# Patient Record
Sex: Male | Born: 1935
Health system: Southern US, Community
[De-identification: ages and names within clinical notes are randomized; demographics above are authoritative.]

## PROBLEM LIST (undated history)

## (undated) DIAGNOSIS — I4891 Unspecified atrial fibrillation: Secondary | ICD-10-CM

## (undated) DIAGNOSIS — N400 Enlarged prostate without lower urinary tract symptoms: Secondary | ICD-10-CM

## (undated) DIAGNOSIS — E785 Hyperlipidemia, unspecified: Secondary | ICD-10-CM

## (undated) DIAGNOSIS — K5790 Diverticulosis of intestine, part unspecified, without perforation or abscess without bleeding: Secondary | ICD-10-CM

## (undated) DIAGNOSIS — S2239XA Fracture of one rib, unspecified side, initial encounter for closed fracture: Secondary | ICD-10-CM

## (undated) DIAGNOSIS — I10 Essential (primary) hypertension: Secondary | ICD-10-CM

## (undated) DIAGNOSIS — N529 Male erectile dysfunction, unspecified: Secondary | ICD-10-CM

## (undated) DIAGNOSIS — Z9289 Personal history of other medical treatment: Secondary | ICD-10-CM

## (undated) DIAGNOSIS — L409 Psoriasis, unspecified: Secondary | ICD-10-CM

## (undated) HISTORY — DX: Benign prostatic hyperplasia without lower urinary tract symptoms: N40.0

## (undated) HISTORY — PX: TRANSURETHRAL RESECTION OF PROSTATE: SHX73

## (undated) HISTORY — DX: Essential (primary) hypertension: I10

## (undated) HISTORY — PX: KIDNEY STONE SURGERY: SHX686

## (undated) HISTORY — PX: SHOULDER SURGERY: SHX246

## (undated) HISTORY — DX: Male erectile dysfunction, unspecified: N52.9

## (undated) HISTORY — PX: TONSILLECTOMY: SUR1361

## (undated) HISTORY — PX: OTHER SURGICAL HISTORY: SHX169

## (undated) HISTORY — DX: Psoriasis, unspecified: L40.9

## (undated) HISTORY — DX: Hyperlipidemia, unspecified: E78.5

## (undated) HISTORY — DX: Diverticulosis of intestine, part unspecified, without perforation or abscess without bleeding: K57.90

## (undated) HISTORY — DX: Personal history of other medical treatment: Z92.89

## (undated) HISTORY — PX: HEMORRHOID SURGERY: SHX153

---

## 1997-07-30 ENCOUNTER — Ambulatory Visit (HOSPITAL_COMMUNITY): Admission: RE | Admit: 1997-07-30 | Discharge: 1997-07-30 | Payer: Self-pay | Admitting: Pulmonary Disease

## 1998-12-24 ENCOUNTER — Encounter: Payer: Self-pay | Admitting: General Surgery

## 1998-12-24 ENCOUNTER — Encounter (INDEPENDENT_AMBULATORY_CARE_PROVIDER_SITE_OTHER): Payer: Self-pay | Admitting: Specialist

## 1998-12-24 ENCOUNTER — Ambulatory Visit (HOSPITAL_COMMUNITY): Admission: RE | Admit: 1998-12-24 | Discharge: 1998-12-24 | Payer: Self-pay | Admitting: General Surgery

## 2007-03-30 ENCOUNTER — Encounter: Admission: RE | Admit: 2007-03-30 | Discharge: 2007-03-30 | Payer: Self-pay | Admitting: Family Medicine

## 2007-12-22 LAB — HM COLONOSCOPY

## 2009-04-21 LAB — FECAL OCCULT BLOOD, GUAIAC: Fecal Occult Blood: NEGATIVE

## 2010-05-11 ENCOUNTER — Encounter: Payer: Self-pay | Admitting: Family Medicine

## 2010-05-11 DIAGNOSIS — E785 Hyperlipidemia, unspecified: Secondary | ICD-10-CM

## 2010-05-11 DIAGNOSIS — N400 Enlarged prostate without lower urinary tract symptoms: Secondary | ICD-10-CM

## 2010-05-11 DIAGNOSIS — L409 Psoriasis, unspecified: Secondary | ICD-10-CM

## 2010-05-11 DIAGNOSIS — K5790 Diverticulosis of intestine, part unspecified, without perforation or abscess without bleeding: Secondary | ICD-10-CM

## 2010-05-11 DIAGNOSIS — N529 Male erectile dysfunction, unspecified: Secondary | ICD-10-CM

## 2010-05-11 DIAGNOSIS — I129 Hypertensive chronic kidney disease with stage 1 through stage 4 chronic kidney disease, or unspecified chronic kidney disease: Secondary | ICD-10-CM | POA: Insufficient documentation

## 2010-05-11 DIAGNOSIS — I10 Essential (primary) hypertension: Secondary | ICD-10-CM

## 2011-11-27 ENCOUNTER — Encounter (HOSPITAL_COMMUNITY): Payer: Self-pay

## 2011-11-30 ENCOUNTER — Encounter (HOSPITAL_COMMUNITY): Payer: Self-pay

## 2011-11-30 ENCOUNTER — Encounter (HOSPITAL_COMMUNITY)
Admission: RE | Admit: 2011-11-30 | Discharge: 2011-11-30 | Disposition: A | Discharge status: Home / Self Care | Payer: Medicare Other | Source: Ambulatory Visit | Attending: Ophthalmology | Admitting: Ophthalmology

## 2011-11-30 LAB — BASIC METABOLIC PANEL
BUN: 9 mg/dL (ref 6–23)
Creatinine, Ser: 1.13 mg/dL (ref 0.50–1.35)
GFR calc Af Amer: 69 mL/min — ABNORMAL LOW (ref >90)
GFR calc non Af Amer: 60 mL/min — ABNORMAL LOW (ref >90)

## 2011-11-30 NOTE — Patient Instructions (Addendum)
Your procedure is scheduled on: 12/04/2011  Report to [facility] at   900      AM.  Call this number if you have problems the morning of surgery: [phone]   Do not eat food or drink liquids :After Midnight.      Take these medicines the morning of surgery with A SIP OF WATER: diovan   Do not wear jewelry, make-up or nail polish.  Do not wear lotions, powders, or perfumes. You may wear deodorant.  Do not shave 48 hours prior to surgery.  Do not bring valuables to the hospital.  Contacts, dentures or bridgework may not be worn into surgery.  Leave suitcase in the car. After surgery it may be brought to your room.  For patients admitted to the hospital, checkout time is 11:00 AM the day of discharge.   Patients discharged the day of surgery will not be allowed to drive home.  :     Please read over the following fact sheets that you were given: Coughing and Deep Breathing, Surgical Site Infection Prevention, Anesthesia Post-op Instructions and Care and Recovery After Surgery    Cataract A cataract is a clouding of the lens of the eye. When a lens becomes cloudy, vision is reduced based on the degree and nature of the clouding. Many cataracts reduce vision to some degree. Some cataracts make people more near-sighted as they develop. Other cataracts increase glare. Cataracts that are ignored and become worse can sometimes look white. The white color can be seen through the pupil. CAUSES   Aging. However, cataracts may occur at any age, even in newborns.   Certain drugs.   Trauma to the eye.   Certain diseases such as diabetes.   Specific eye diseases such as chronic inflammation inside the eye or a sudden attack of a rare form of glaucoma.   Inherited or acquired medical problems.  SYMPTOMS   Gradual, progressive drop in vision in the affected eye.   Severe, rapid visual loss. This most often happens when trauma is the cause.  DIAGNOSIS  To detect a cataract, an eye doctor  examines the lens. Cataracts are best diagnosed with an exam of the eyes with the pupils enlarged (dilated) by drops.  TREATMENT  For an early cataract, vision may improve by using different eyeglasses or stronger lighting. If that does not help your vision, surgery is the only effective treatment. A cataract needs to be surgically removed when vision loss interferes with your everyday activities, such as driving, reading, or watching TV. A cataract may also have to be removed if it prevents examination or treatment of another eye problem. Surgery removes the cloudy lens and usually replaces it with a substitute lens (intraocular lens, IOL).  At a time when both you and your doctor agree, the cataract will be surgically removed. If you have cataracts in both eyes, only one is usually removed at a time. This allows the operated eye to heal and be out of danger from any possible problems after surgery (such as infection or poor wound healing). In rare cases, a cataract may be doing damage to your eye. In these cases, your caregiver may advise surgical removal right away. The vast majority of people who have cataract surgery have better vision afterward. HOME CARE INSTRUCTIONS  If you are not planning surgery, you may be asked to do the following:  Use different eyeglasses.   Use stronger or brighter lighting.   Ask your eye doctor about  reducing your medicine dose or changing medicines if it is thought that a medicine caused your cataract. Changing medicines does not make the cataract go away on its own.   Become familiar with your surroundings. Poor vision can lead to injury. Avoid bumping into things on the affected side. You are at a higher risk for tripping or falling.   Exercise extreme care when driving or operating machinery.   Wear sunglasses if you are sensitive to bright light or experiencing problems with glare.  SEEK IMMEDIATE MEDICAL CARE IF:   You have a worsening or sudden vision  loss.   You notice redness, swelling, or increasing pain in the eye.   You have a fever.  Document Released: 02/06/2005 Document Revised: 01/26/2011 Document Reviewed: 09/30/2010 [facility] Patient Information 2012 [place], [place].PATIENT INSTRUCTIONS POST-ANESTHESIA  IMMEDIATELY FOLLOWING SURGERY:  Do not drive or operate machinery for the first twenty four hours after surgery.  Do not make any important decisions for twenty four hours after surgery or while taking narcotic pain medications or sedatives.  If you develop intractable nausea and vomiting or a severe headache please notify your doctor immediately.  FOLLOW-UP:  Please make an appointment with your surgeon as instructed. You do not need to follow up with anesthesia unless specifically instructed to do so.  WOUND CARE INSTRUCTIONS (if applicable):  Keep a dry clean dressing on the anesthesia/puncture wound site if there is drainage.  Once the wound has quit draining you may leave it open to air.  Generally you should leave the bandage intact for twenty four hours unless there is drainage.  If the epidural site drains for more than 36-48 hours please call the anesthesia department.  QUESTIONS?:  Please feel free to call your physician or the hospital operator if you have any questions, and they will be happy to assist you.

## 2011-12-01 MED ORDER — NEOMYCIN-POLYMYXIN-DEXAMETH 3.5-10000-0.1 OP OINT
TOPICAL_OINTMENT | OPHTHALMIC | Status: AC
  Filled 2011-12-01: qty 3.5

## 2011-12-01 MED ORDER — CYCLOPENTOLATE HCL 1 % OP SOLN
OPHTHALMIC | Status: AC
  Filled 2011-12-01: qty 2

## 2011-12-01 MED ORDER — LIDOCAINE HCL 3.5 % OP GEL
OPHTHALMIC | Status: AC
  Filled 2011-12-01: qty 5

## 2011-12-01 MED ORDER — LIDOCAINE HCL (PF) 1 % IJ SOLN
INTRAMUSCULAR | Status: AC
  Filled 2011-12-01: qty 2

## 2011-12-01 MED ORDER — TETRACAINE HCL 0.5 % OP SOLN
OPHTHALMIC | Status: AC
  Filled 2011-12-01: qty 2

## 2011-12-01 MED ORDER — PHENYLEPHRINE HCL 2.5 % OP SOLN
OPHTHALMIC | Status: AC
  Filled 2011-12-01: qty 2

## 2011-12-04 ENCOUNTER — Encounter (HOSPITAL_COMMUNITY): Payer: Self-pay | Admitting: *Deleted

## 2011-12-04 ENCOUNTER — Encounter (HOSPITAL_COMMUNITY): Payer: Self-pay | Admitting: Anesthesiology

## 2011-12-04 ENCOUNTER — Ambulatory Visit (HOSPITAL_COMMUNITY)
Admission: RE | Admit: 2011-12-04 | Discharge: 2011-12-04 | Disposition: A | Discharge status: Home / Self Care | Payer: Medicare Other | Source: Ambulatory Visit | Attending: Ophthalmology | Admitting: Ophthalmology

## 2011-12-04 ENCOUNTER — Encounter (HOSPITAL_COMMUNITY)
Admission: RE | Disposition: A | Discharge status: Home / Self Care | Payer: Self-pay | Source: Ambulatory Visit | Attending: Ophthalmology

## 2011-12-04 ENCOUNTER — Ambulatory Visit (HOSPITAL_COMMUNITY): Payer: Medicare Other | Admitting: Anesthesiology

## 2011-12-04 DIAGNOSIS — Z01810 Encounter for preprocedural cardiovascular examination: Secondary | ICD-10-CM | POA: Insufficient documentation

## 2011-12-04 DIAGNOSIS — I10 Essential (primary) hypertension: Secondary | ICD-10-CM | POA: Insufficient documentation

## 2011-12-04 DIAGNOSIS — H2510 Age-related nuclear cataract, unspecified eye: Secondary | ICD-10-CM | POA: Insufficient documentation

## 2011-12-04 DIAGNOSIS — Z01812 Encounter for preprocedural laboratory examination: Secondary | ICD-10-CM | POA: Insufficient documentation

## 2011-12-04 HISTORY — PX: CATARACT EXTRACTION W/PHACO: SHX586

## 2011-12-04 SURGERY — PHACOEMULSIFICATION, CATARACT, WITH IOL INSERTION
Anesthesia: Monitor Anesthesia Care | Site: Eye | Laterality: Left | Wound class: Clean

## 2011-12-04 MED ORDER — FENTANYL CITRATE 0.05 MG/ML IJ SOLN: 25.0000 ug | INTRAMUSCULAR | Status: DC | PRN

## 2011-12-04 MED ORDER — LIDOCAINE HCL 3.5 % OP GEL
1.0000 "application " | Freq: Once | OPHTHALMIC | Status: AC
  Administered 2011-12-04: 1 via OPHTHALMIC

## 2011-12-04 MED ORDER — CYCLOPENTOLATE-PHENYLEPHRINE 0.2-1 % OP SOLN: 1.0000 [drp] | OPHTHALMIC | Status: DC

## 2011-12-04 MED ORDER — LIDOCAINE HCL (PF) 1 % IJ SOLN
INTRAMUSCULAR | Status: DC | PRN
  Administered 2011-12-04: .4 mL

## 2011-12-04 MED ORDER — PROVISC 10 MG/ML IO SOLN
INTRAOCULAR | Status: DC | PRN
  Administered 2011-12-04: 8.5 mg via INTRAOCULAR

## 2011-12-04 MED ORDER — EPINEPHRINE HCL 1 MG/ML IJ SOLN
INTRAOCULAR | Status: DC | PRN
  Administered 2011-12-04: 10:00:00

## 2011-12-04 MED ORDER — BSS IO SOLN
INTRAOCULAR | Status: DC | PRN
  Administered 2011-12-04: 15 mL via INTRAOCULAR

## 2011-12-04 MED ORDER — MIDAZOLAM HCL 2 MG/2ML IJ SOLN
1.0000 mg | INTRAMUSCULAR | Status: DC | PRN
  Administered 2011-12-04: 2 mg via INTRAVENOUS

## 2011-12-04 MED ORDER — MIDAZOLAM HCL 2 MG/2ML IJ SOLN
INTRAMUSCULAR | Status: AC
  Filled 2011-12-04: qty 2

## 2011-12-04 MED ORDER — ONDANSETRON HCL 4 MG/2ML IJ SOLN: 4.0000 mg | Freq: Once | INTRAMUSCULAR | Status: DC | PRN

## 2011-12-04 MED ORDER — CYCLOPENTOLATE HCL 1 % OP SOLN
1.0000 [drp] | OPHTHALMIC | Status: AC
  Administered 2011-12-04 (×3): 1 [drp] via OPHTHALMIC

## 2011-12-04 MED ORDER — NEOMYCIN-POLYMYXIN-DEXAMETH 0.1 % OP OINT
TOPICAL_OINTMENT | OPHTHALMIC | Status: DC | PRN
  Administered 2011-12-04: 1 via OPHTHALMIC

## 2011-12-04 MED ORDER — LACTATED RINGERS IV SOLN
INTRAVENOUS | Status: DC
  Administered 2011-12-04: 1000 mL via INTRAVENOUS

## 2011-12-04 MED ORDER — PHENYLEPHRINE HCL 2.5 % OP SOLN
1.0000 [drp] | OPHTHALMIC | Status: AC
  Administered 2011-12-04 (×3): 1 [drp] via OPHTHALMIC

## 2011-12-04 MED ORDER — TETRACAINE HCL 0.5 % OP SOLN
1.0000 [drp] | OPHTHALMIC | Status: AC
  Administered 2011-12-04 (×3): 1 [drp] via OPHTHALMIC

## 2011-12-04 MED ORDER — POVIDONE-IODINE 5 % OP SOLN
OPHTHALMIC | Status: DC | PRN
  Administered 2011-12-04: 1 via OPHTHALMIC

## 2011-12-04 SURGICAL SUPPLY — 11 items
CLOTH BEACON ORANGE TIMEOUT ST (SAFETY) ×1 IMPLANT
EYE SHIELD UNIVERSAL CLEAR (GAUZE/BANDAGES/DRESSINGS) ×1 IMPLANT
GLOVE BIOGEL PI IND STRL 6.5 (GLOVE) IMPLANT
GLOVE BIOGEL PI INDICATOR 6.5 (GLOVE) ×1
GLOVE EXAM NITRILE MD LF STRL (GLOVE) ×1 IMPLANT
PAD ARMBOARD 7.5X6 YLW CONV (MISCELLANEOUS) ×1 IMPLANT
SIGHTPATH CAT PROC W REG LENS (Ophthalmic Related) ×2 IMPLANT
SYR TB 1ML LL NO SAFETY (SYRINGE) ×1 IMPLANT
TAPE SURG TRANSPORE 1 IN (GAUZE/BANDAGES/DRESSINGS) IMPLANT
TAPE SURGICAL TRANSPORE 1 IN (GAUZE/BANDAGES/DRESSINGS) ×1
WATER STERILE IRR 250ML POUR (IV SOLUTION) ×1 IMPLANT

## 2011-12-04 NOTE — Transfer of Care (Signed)
Immediate Anesthesia Transfer of Care Note  Patient: Brian Blankenship  Procedure(s) Performed: Procedure(s) (LRB): CATARACT EXTRACTION PHACO AND INTRAOCULAR LENS PLACEMENT (IOC) (Left)  Patient Location: Shortstay  Anesthesia Type: MAC  Level of Consciousness: awake  Airway & Oxygen Therapy: Patient Spontanous Breathing   Post-op Assessment: Report given to PACU RN, Post -op Vital signs reviewed and stable and Patient moving all extremities  Post vital signs: Reviewed and stable  Complications: No apparent anesthesia complications

## 2011-12-04 NOTE — Brief Op Note (Signed)
Pre-Op Dx: Cataract OS Post-Op Dx: Cataract OS Surgeon: Scotland Korver Anesthesia: Topical with MAC Surgery: Cataract Extraction with Intraocular lens Implant OS Implant: B&L enVista Specimen: None Complications: None 

## 2011-12-04 NOTE — Anesthesia Preprocedure Evaluation (Signed)
Anesthesia Evaluation  Patient identified by MRN, date of birth, ID band Patient awake    Reviewed: Allergy & Precautions, H&P , NPO status , Patient's Chart, lab work & pertinent test results  Airway Mallampati: II      Dental  (+) Edentulous Upper and Edentulous Lower   Pulmonary neg pulmonary ROS,  breath sounds clear to auscultation        Cardiovascular hypertension, Pt. on medications Rhythm:Regular     Neuro/Psych    GI/Hepatic negative GI ROS,   Endo/Other    Renal/GU Renal disease     Musculoskeletal   Abdominal   Peds  Hematology   Anesthesia Other Findings   Reproductive/Obstetrics                           Anesthesia Physical Anesthesia Plan  ASA: II  Anesthesia Plan: MAC   Post-op Pain Management:    Induction: Intravenous  Airway Management Planned: Nasal Cannula  Additional Equipment:   Intra-op Plan:   Post-operative Plan:   Informed Consent: I have reviewed the patients History and Physical, chart, labs and discussed the procedure including the risks, benefits and alternatives for the proposed anesthesia with the patient or authorized representative who has indicated his/her understanding and acceptance.     Plan Discussed with:   Anesthesia Plan Comments:         Anesthesia Quick Evaluation  

## 2011-12-04 NOTE — Anesthesia Postprocedure Evaluation (Signed)
  Anesthesia Post-op Note  Patient: Brian Blankenship  Procedure(s) Performed: Procedure(s) (LRB): CATARACT EXTRACTION PHACO AND INTRAOCULAR LENS PLACEMENT (IOC) (Left)  Patient Location:  Short Stay  Anesthesia Type: MAC  Level of Consciousness: awake  Airway and Oxygen Therapy: Patient Spontanous Breathing  Post-op Pain: none  Post-op Assessment: Post-op Vital signs reviewed, Patient's Cardiovascular Status Stable, Respiratory Function Stable, Patent Airway, No signs of Nausea or vomiting and Pain level controlled  Post-op Vital Signs: Reviewed and stable  Complications: No apparent anesthesia complications

## 2011-12-04 NOTE — H&P (Signed)
I have reviewed the H&P, the patient was re-examined, and I have identified no interval changes in medical condition and plan of care since the history and physical of record  

## 2011-12-05 NOTE — Op Note (Signed)
NAME:  Brian Blankenship, Brian Blankenship NO.:  [id number]  MEDICAL RECORD NO.:  [id number]  LOCATION:  [facility]                          FACILITY:  [facility]  PHYSICIAN:  Susanne Greenhouse, MD       DATE OF BIRTH:  [birth year]  DATE OF PROCEDURE:  12/04/2011 DATE OF DISCHARGE:  12/04/2011                              OPERATIVE REPORT   PREOPERATIVE DIAGNOSIS:  Nuclear cataract, left eye, diagnosis code 366.16.  POSTOPERATIVE DIAGNOSIS:  Nuclear cataract, left eye, diagnosis code 366.16.  SURGEON:  Susanne Greenhouse, MD  OPERATION PERFORMED:  Phacoemulsification with posterior chamber intraocular lens implantation, left eye.  ANESTHESIA:  Topical with IV sedation.  OPERATIVE SUMMARY:  In the preoperative area, dilating drops were placed into the left eye.  The patient was then brought into the operating room where he was placed under topical anesthesia and IV sedation.  The eye was then prepped and draped.  Beginning with a 75 blade, a paracentesis port was made at the surgeon's 2 o'clock position.  The anterior chamber was then filled with a 1% nonpreserved lidocaine solution with epinephrine.  This was followed by Viscoat to deepen the chamber.  A small fornix-based peritomy was performed superiorly.  Next, a single iris hook was placed through the limbus superiorly.  A 2.4-mm keratome blade was then used to make a clear corneal incision over the iris hook. A bent cystotome needle and Utrata forceps were used to create a continuous tear capsulotomy.  Hydrodissection was performed using balanced salt solution on a fine cannula.  The lens nucleus was then removed using phacoemulsification in a quadrant cracking technique.  The cortical material was then removed with irrigation and aspiration.  The capsular bag and anterior chamber were refilled with Provisc.  The wound was widened to approximately 3 mm and a posterior chamber intraocular lens was placed into the capsular bag without difficulty  using an [employer] lens injecting system.  A single 10-0 nylon suture was then used to close the incision as well as stromal hydration.  The Provisc was removed from the anterior chamber and capsular bag with irrigation and aspiration.  At this point, the wounds were tested for leak, which were negative.  The anterior chamber remained deep and stable.  The patient tolerated the procedure well.  There were no operative complications, and he awoke from topical anesthesia and IV sedation without problem.  No surgical specimens.  Prosthetic device used is a Actuary enVista posterior chamber lens, model MX60, power of 16.0, serial number is [id number].          ______________________________ Susanne Greenhouse, MD     KEH/MEDQ  D:  12/04/2011  T:  12/05/2011  Job:  [id number]

## 2011-12-07 ENCOUNTER — Encounter (HOSPITAL_COMMUNITY): Payer: Self-pay | Admitting: Ophthalmology

## 2011-12-11 ENCOUNTER — Other Ambulatory Visit: Payer: Self-pay | Admitting: Family Medicine

## 2011-12-11 DIAGNOSIS — R918 Other nonspecific abnormal finding of lung field: Secondary | ICD-10-CM

## 2011-12-11 MED ORDER — PROMETHAZINE HCL 25 MG/ML IJ SOLN
INTRAMUSCULAR | Status: AC
  Filled 2011-12-11: qty 1

## 2011-12-13 ENCOUNTER — Encounter (HOSPITAL_COMMUNITY): Payer: Self-pay | Admitting: *Deleted

## 2011-12-13 ENCOUNTER — Encounter (HOSPITAL_COMMUNITY): Admission: RE | Admit: 2011-12-13 | Payer: Medicare Other | Source: Ambulatory Visit | Admitting: Ophthalmology

## 2011-12-15 MED ORDER — LIDOCAINE HCL 3.5 % OP GEL
OPHTHALMIC | Status: AC
  Filled 2011-12-15: qty 5

## 2011-12-15 MED ORDER — NEOMYCIN-POLYMYXIN-DEXAMETH 3.5-10000-0.1 OP OINT
TOPICAL_OINTMENT | OPHTHALMIC | Status: AC
  Filled 2011-12-15: qty 3.5

## 2011-12-15 MED ORDER — CYCLOPENTOLATE-PHENYLEPHRINE 0.2-1 % OP SOLN
OPHTHALMIC | Status: AC
  Filled 2011-12-15: qty 2

## 2011-12-15 MED ORDER — TETRACAINE HCL 0.5 % OP SOLN
OPHTHALMIC | Status: AC
  Filled 2011-12-15: qty 2

## 2011-12-18 ENCOUNTER — Ambulatory Visit (HOSPITAL_COMMUNITY)
Admission: RE | Admit: 2011-12-18 | Discharge: 2011-12-18 | Disposition: A | Discharge status: Home / Self Care | Payer: Medicare Other | Source: Ambulatory Visit | Attending: Ophthalmology | Admitting: Ophthalmology

## 2011-12-18 ENCOUNTER — Encounter (HOSPITAL_COMMUNITY): Payer: Self-pay | Admitting: Anesthesiology

## 2011-12-18 ENCOUNTER — Encounter (HOSPITAL_COMMUNITY)
Admission: RE | Disposition: A | Discharge status: Home / Self Care | Payer: Self-pay | Source: Ambulatory Visit | Attending: Ophthalmology

## 2011-12-18 ENCOUNTER — Encounter (HOSPITAL_COMMUNITY): Payer: Self-pay

## 2011-12-18 ENCOUNTER — Ambulatory Visit (HOSPITAL_COMMUNITY): Payer: Medicare Other | Admitting: Anesthesiology

## 2011-12-18 DIAGNOSIS — H2510 Age-related nuclear cataract, unspecified eye: Secondary | ICD-10-CM | POA: Insufficient documentation

## 2011-12-18 DIAGNOSIS — I10 Essential (primary) hypertension: Secondary | ICD-10-CM | POA: Insufficient documentation

## 2011-12-18 HISTORY — PX: CATARACT EXTRACTION W/PHACO: SHX586

## 2011-12-18 SURGERY — PHACOEMULSIFICATION, CATARACT, WITH IOL INSERTION
Anesthesia: Monitor Anesthesia Care | Site: Eye | Laterality: Right | Wound class: Clean

## 2011-12-18 MED ORDER — ONDANSETRON HCL 4 MG/2ML IJ SOLN: 4.0000 mg | Freq: Once | INTRAMUSCULAR | Status: DC | PRN

## 2011-12-18 MED ORDER — TETRACAINE HCL 0.5 % OP SOLN
1.0000 [drp] | OPHTHALMIC | Status: AC
  Administered 2011-12-18 (×3): 1 [drp] via OPHTHALMIC

## 2011-12-18 MED ORDER — CYCLOPENTOLATE-PHENYLEPHRINE 0.2-1 % OP SOLN
1.0000 [drp] | OPHTHALMIC | Status: AC
  Administered 2011-12-18 (×3): 1 [drp] via OPHTHALMIC

## 2011-12-18 MED ORDER — PHENYLEPHRINE HCL 2.5 % OP SOLN
1.0000 [drp] | OPHTHALMIC | Status: AC
  Administered 2011-12-18 (×3): 1 [drp] via OPHTHALMIC

## 2011-12-18 MED ORDER — PROVISC 10 MG/ML IO SOLN
INTRAOCULAR | Status: DC | PRN
  Administered 2011-12-18: 8.5 mg via INTRAOCULAR

## 2011-12-18 MED ORDER — LIDOCAINE HCL (PF) 1 % IJ SOLN
INTRAMUSCULAR | Status: DC | PRN
  Administered 2011-12-18: 2 mL

## 2011-12-18 MED ORDER — CYCLOPENTOLATE HCL 1 % OP SOLN: 1.0000 [drp] | OPHTHALMIC | Status: DC

## 2011-12-18 MED ORDER — LIDOCAINE HCL 3.5 % OP GEL
1.0000 "application " | Freq: Once | OPHTHALMIC | Status: AC
  Administered 2011-12-18: 1 via OPHTHALMIC

## 2011-12-18 MED ORDER — MIDAZOLAM HCL 2 MG/2ML IJ SOLN
INTRAMUSCULAR | Status: AC
  Filled 2011-12-18: qty 2

## 2011-12-18 MED ORDER — LACTATED RINGERS IV SOLN
INTRAVENOUS | Status: DC
  Administered 2011-12-18: 1000 mL via INTRAVENOUS

## 2011-12-18 MED ORDER — EPINEPHRINE HCL 1 MG/ML IJ SOLN
INTRAOCULAR | Status: DC | PRN
  Administered 2011-12-18: 12:00:00

## 2011-12-18 MED ORDER — BSS IO SOLN
INTRAOCULAR | Status: DC | PRN
  Administered 2011-12-18: 500 mL via INTRAOCULAR

## 2011-12-18 MED ORDER — FENTANYL CITRATE 0.05 MG/ML IJ SOLN: 25.0000 ug | INTRAMUSCULAR | Status: DC | PRN

## 2011-12-18 MED ORDER — POVIDONE-IODINE 5 % OP SOLN
OPHTHALMIC | Status: DC | PRN
  Administered 2011-12-18: 1 via OPHTHALMIC

## 2011-12-18 MED ORDER — MIDAZOLAM HCL 2 MG/2ML IJ SOLN
1.0000 mg | INTRAMUSCULAR | Status: DC | PRN
  Administered 2011-12-18: 2 mg via INTRAVENOUS

## 2011-12-18 SURGICAL SUPPLY — 31 items
CAPSULAR TENSION RING-AMO (OPHTHALMIC RELATED) IMPLANT
CLOTH BEACON ORANGE TIMEOUT ST (SAFETY) ×1 IMPLANT
EYE SHIELD UNIVERSAL CLEAR (GAUZE/BANDAGES/DRESSINGS) ×1 IMPLANT
GLOVE BIO SURGEON STRL SZ 6.5 (GLOVE) IMPLANT
GLOVE BIOGEL PI IND STRL 6.5 (GLOVE) IMPLANT
GLOVE BIOGEL PI IND STRL 7.0 (GLOVE) IMPLANT
GLOVE BIOGEL PI IND STRL 7.5 (GLOVE) IMPLANT
GLOVE BIOGEL PI INDICATOR 6.5 (GLOVE) ×1
GLOVE BIOGEL PI INDICATOR 7.0 (GLOVE)
GLOVE BIOGEL PI INDICATOR 7.5 (GLOVE)
GLOVE ECLIPSE 6.5 STRL STRAW (GLOVE) IMPLANT
GLOVE ECLIPSE 7.0 STRL STRAW (GLOVE) IMPLANT
GLOVE ECLIPSE 7.5 STRL STRAW (GLOVE) IMPLANT
GLOVE EXAM NITRILE LRG STRL (GLOVE) IMPLANT
GLOVE EXAM NITRILE MD LF STRL (GLOVE) ×1 IMPLANT
GLOVE SKINSENSE NS SZ6.5 (GLOVE)
GLOVE SKINSENSE NS SZ7.0 (GLOVE)
GLOVE SKINSENSE STRL SZ6.5 (GLOVE) IMPLANT
GLOVE SKINSENSE STRL SZ7.0 (GLOVE) IMPLANT
KIT VITRECTOMY (OPHTHALMIC RELATED) IMPLANT
PAD ARMBOARD 7.5X6 YLW CONV (MISCELLANEOUS) ×1 IMPLANT
PROC W NO LENS (INTRAOCULAR LENS)
PROC W SPEC LENS (INTRAOCULAR LENS)
PROCESS W NO LENS (INTRAOCULAR LENS) IMPLANT
PROCESS W SPEC LENS (INTRAOCULAR LENS) IMPLANT
RING MALYGIN (MISCELLANEOUS) IMPLANT
SIGHTPATH CAT PROC W REG LENS (Ophthalmic Related) ×2 IMPLANT
SYR TB 1ML LL NO SAFETY (SYRINGE) ×1 IMPLANT
TAPE CLOTH SOFT 2X10 (GAUZE/BANDAGES/DRESSINGS) ×1 IMPLANT
VISCOELASTIC ADDITIONAL (OPHTHALMIC RELATED) IMPLANT
WATER STERILE IRR 250ML POUR (IV SOLUTION) ×1 IMPLANT

## 2011-12-18 NOTE — H&P (Signed)
I have reviewed the H&P, the patient was re-examined, and I have identified no interval changes in medical condition and plan of care since the history and physical of record  

## 2011-12-18 NOTE — Anesthesia Postprocedure Evaluation (Signed)
  Anesthesia Post-op Note  Patient: Brian Blankenship  Procedure(s) Performed: Procedure(s) (LRB) with comments: CATARACT EXTRACTION PHACO AND INTRAOCULAR LENS PLACEMENT (IOC) (Right) - CDE 19.22  Patient Location: PACU  Anesthesia Type: MAC   Level of Consciousness: awake, alert , oriented and patient cooperative  Airway and Oxygen Therapy: Patient Spontanous Breathing  Post-op Pain: none  Post-op Assessment: Post-op Vital signs reviewed, Patient's Cardiovascular Status Stable, Respiratory Function Stable, Patent Airway, No signs of Nausea or vomiting and Pain level controlled  Post-op Vital Signs: Reviewed and stable  Complications: No apparent anesthesia complications

## 2011-12-18 NOTE — Transfer of Care (Signed)
Immediate Anesthesia Transfer of Care Note  Patient: Brian Blankenship  Procedure(s) Performed: Procedure(s) (LRB) with comments: CATARACT EXTRACTION PHACO AND INTRAOCULAR LENS PLACEMENT (IOC) (Right) - CDE 19.22  Patient Location: PACU and Short Stay  Anesthesia Type:MAC  Level of Consciousness: awake, alert , oriented and patient cooperative  Airway & Oxygen Therapy: Patient Spontanous Breathing  Post-op Assessment: Report given to PACU RN, Post -op Vital signs reviewed and stable and Patient moving all extremities  Post vital signs: Reviewed and stable  Complications: No apparent anesthesia complications

## 2011-12-18 NOTE — Anesthesia Preprocedure Evaluation (Signed)
Anesthesia Evaluation  Patient identified by MRN, date of birth, ID band Patient awake    Reviewed: Allergy & Precautions, H&P , NPO status , Patient's Chart, lab work & pertinent test results  Airway Mallampati: II      Dental  (+) Edentulous Upper and Edentulous Lower   Pulmonary neg pulmonary ROS,  breath sounds clear to auscultation        Cardiovascular hypertension, Pt. on medications Rhythm:Regular     Neuro/Psych    GI/Hepatic negative GI ROS,   Endo/Other    Renal/GU Renal disease     Musculoskeletal   Abdominal   Peds  Hematology   Anesthesia Other Findings   Reproductive/Obstetrics                           Anesthesia Physical Anesthesia Plan  ASA: II  Anesthesia Plan: MAC   Post-op Pain Management:    Induction: Intravenous  Airway Management Planned: Nasal Cannula  Additional Equipment:   Intra-op Plan:   Post-operative Plan:   Informed Consent: I have reviewed the patients History and Physical, chart, labs and discussed the procedure including the risks, benefits and alternatives for the proposed anesthesia with the patient or authorized representative who has indicated his/her understanding and acceptance.     Plan Discussed with:   Anesthesia Plan Comments:         Anesthesia Quick Evaluation

## 2011-12-18 NOTE — Brief Op Note (Signed)
Pre-Op Dx: Cataract OD Post-Op Dx: Cataract OD Surgeon: Early Steel Anesthesia: Topical with MAC Surgery: Cataract Extraction with Intraocular lens Implant OD Implant: B&L enVista Specimen: None Complications: None 

## 2011-12-19 NOTE — Op Note (Signed)
NAME:  DOMANIC, MATUSEK NO.:  [id number]  MEDICAL RECORD NO.:  [id number]  LOCATION:  [facility]                          FACILITY:  [facility]  PHYSICIAN:  Susanne Greenhouse, MD       DATE OF BIRTH:  May 24, 1930  DATE OF PROCEDURE:  12/18/2011 DATE OF DISCHARGE:  12/18/2011                              OPERATIVE REPORT   PREOPERATIVE DIAGNOSIS:  Nuclear cataract, right eye, diagnosis code 366.16.  POSTOPERATIVE DIAGNOSIS:  Nuclear cataract, right eye, diagnosis code 366.16.  SURGEON:  Susanne Greenhouse, MD  OPERATION PERFORMED:  Phacoemulsification with posterior chamber intraocular lens implantation, right eye.  ANESTHESIA:   Topical with IV sedation.  OPERATIVE SUMMARY:  In the preoperative area, dilating drops were placed into the right eye.  The patient was then brought into the operating room where he was placed under topical anesthesia and IV sedation.  The eye was then prepped and draped.  Beginning with a 75 blade, a paracentesis port was made at the surgeon's 2 o'clock position.  The anterior chamber was then filled with a 1% nonpreserved lidocaine solution with epinephrine.  This was followed by Viscoat to deepen the chamber.  A small fornix-based peritomy was performed superiorly.  Next, a single iris hook was placed through the limbus superiorly.  A 2.4-mm keratome blade was then used to make a clear corneal incision over the iris hook.  A bent cystotome needle and Utrata forceps were used to create a continuous tear capsulotomy.  Hydrodissection was performed using balanced salt solution on a fine cannula.  The lens nucleus was then removed using phacoemulsification in a quadrant cracking technique. The cortical material was then removed with irrigation and aspiration. The capsular bag and anterior chamber were refilled with Provisc.  The wound was widened to approximately 3 mm and a posterior chamber intraocular lens was placed into the capsular bag without  difficulty using an [employer] lens injecting system.  A single 10-0 nylon suture was then used to close the incision as well as stromal hydration. The Provisc was removed from the anterior chamber and capsular bag with irrigation and aspiration.  At this point, the wounds were tested for leak, which were negative.  The anterior chamber remained deep and stable.  The patient tolerated the procedure well.  There were no operative complications, and he awoke from topical anesthesia and IV sedation without problem.  No surgical specimens.  Prosthetic device used is a Designer, industrial/product lens, model MX60, power of 17, serial number is [id number].          ______________________________ Susanne Greenhouse, MD     KEH/MEDQ  D:  12/18/2011  T:  12/19/2011  Job:  [id number]

## 2011-12-20 ENCOUNTER — Encounter (HOSPITAL_COMMUNITY): Payer: Self-pay | Admitting: Ophthalmology

## 2011-12-20 MED ORDER — NEOMYCIN-POLYMYXIN-DEXAMETH 0.1 % OP OINT
TOPICAL_OINTMENT | OPHTHALMIC | Status: DC | PRN
  Administered 2011-12-18: 1 via OPHTHALMIC

## 2012-01-02 ENCOUNTER — Ambulatory Visit (HOSPITAL_COMMUNITY)
Admission: RE | Admit: 2012-01-02 | Discharge: 2012-01-02 | Disposition: A | Discharge status: Home / Self Care | Payer: Medicare Other | Source: Ambulatory Visit | Attending: Family Medicine | Admitting: Family Medicine

## 2012-01-02 DIAGNOSIS — I10 Essential (primary) hypertension: Secondary | ICD-10-CM | POA: Insufficient documentation

## 2012-01-02 DIAGNOSIS — J984 Other disorders of lung: Secondary | ICD-10-CM | POA: Insufficient documentation

## 2012-01-02 DIAGNOSIS — R918 Other nonspecific abnormal finding of lung field: Secondary | ICD-10-CM | POA: Insufficient documentation

## 2012-06-19 ENCOUNTER — Other Ambulatory Visit: Payer: Self-pay | Admitting: *Deleted

## 2012-06-19 DIAGNOSIS — R918 Other nonspecific abnormal finding of lung field: Secondary | ICD-10-CM

## 2012-06-24 ENCOUNTER — Other Ambulatory Visit: Payer: Self-pay

## 2012-06-24 DIAGNOSIS — R918 Other nonspecific abnormal finding of lung field: Secondary | ICD-10-CM

## 2012-06-26 ENCOUNTER — Ambulatory Visit (HOSPITAL_COMMUNITY)
Admission: RE | Admit: 2012-06-26 | Discharge: 2012-06-26 | Disposition: A | Discharge status: Home / Self Care | Payer: Medicare Other | Source: Ambulatory Visit | Attending: Family Medicine | Admitting: Family Medicine

## 2012-06-26 DIAGNOSIS — Z09 Encounter for follow-up examination after completed treatment for conditions other than malignant neoplasm: Secondary | ICD-10-CM | POA: Insufficient documentation

## 2012-06-26 DIAGNOSIS — R918 Other nonspecific abnormal finding of lung field: Secondary | ICD-10-CM | POA: Insufficient documentation

## 2012-07-04 ENCOUNTER — Telehealth: Payer: Self-pay | Admitting: Family Medicine

## 2012-07-04 NOTE — Telephone Encounter (Signed)
A phone call this evening from Larena Sox regarding a repeat CT scan for his father on a followup of pulmonary nodules and atherosclerosis. I returned a phone call to him, but he was not available. I did call his sister Lanora Manis and reviewed the CT scan report on their father with Lanora Manis. Pulmonary nodules that were present had not changed from 6 months ago.The atherosclerosis that was present in the coronary arteries was still present. Mr. Loflin has seen Dr. Stevphen Meuse, a cardiologist in [facility] 2 or 3 years ago, and has seen him since. She indicated that her dad was having no chest pain or tightness. I recommended to her that they call Dr. Alonza Bogus, and let him see her dad again if he felt it was necessary and do any further testing that he thought would be appropriate. I also recommended that I could call a cardiologist in [place] and that could possibly do a cardiac CT. Ask her to let me no and I would be glad to make those arrangements for him. Otherwise the chest CT should be repeated in one year. She is now aware of that.

## 2012-07-17 ENCOUNTER — Encounter: Payer: Self-pay | Admitting: *Deleted

## 2012-07-25 ENCOUNTER — Encounter: Payer: Self-pay | Admitting: Critical Care Medicine

## 2012-07-25 ENCOUNTER — Ambulatory Visit (INDEPENDENT_AMBULATORY_CARE_PROVIDER_SITE_OTHER): Payer: Medicare Other | Admitting: Critical Care Medicine

## 2012-07-25 VITALS — BP 132/72 | HR 74 | Temp 98.3°F | Ht 68.0 in | Wt 159.0 lb

## 2012-07-25 DIAGNOSIS — R918 Other nonspecific abnormal finding of lung field: Secondary | ICD-10-CM

## 2012-07-25 NOTE — Patient Instructions (Addendum)
A repeat CT Chest will be obtained in one year. If no change then no further CTs will be needed This is a benign granuloma and is no consequence I will call with scan result in one year

## 2012-07-25 NOTE — Assessment & Plan Note (Signed)
Multiple micronodules with 2 larger nodules no larger than 4 mm in left upper lobe and left lower lobe noncalcified and of no consequence these are likely granulomas Plan Repeat CT scan of chest in 1 year No additional workup indicated Note the cough the patient is experiencing is likely from postnasal drip syndrome

## 2012-07-25 NOTE — Progress Notes (Signed)
Subjective:    Patient ID: Brian Blankenship, male    DOB: Jul 18, 1930, 79 y.o.   MRN: [id number] Chief Complaint  Patient presents with  . Pulmonary Consult    Self referral.  Would like to f/u for results from CT Chest done at [facility] in May 2014.    Cough This is a recurrent problem. The problem has been waxing and waning. The cough is productive of sputum (mucus in AM white/clear). Associated symptoms include postnasal drip and rhinorrhea. Pertinent negatives include no chest pain, chills, ear congestion, ear pain, fever, headaches, heartburn, hemoptysis, myalgias, nasal congestion, rash, sore throat, shortness of breath, sweats, weight loss or wheezing. The symptoms are aggravated by pollens and fumes. Risk factors for lung disease include smoking/tobacco exposure (quit 1957 smoking , only smoked 71yrs. ). There is no history of asthma, bronchiectasis, bronchitis, COPD, emphysema, environmental allergies or pneumonia.     Past Medical History  Diagnosis Date  . Psoriasis   . Diverticulosis   . HTN (hypertension)   . BPH (benign prostatic hyperplasia)   . Other and unspecified hyperlipidemia   . ED (erectile dysfunction)      Family History  Problem Relation Age of Onset  . Heart attack Mother   . Stroke Father      History   Social History  . Marital Status: Widowed    Spouse Name: N/A    Number of Children: 2  . Years of Education: N/A   Occupational History  . Retired     Environmental education officer   Social History Main Topics  . Smoking status: Former Smoker -- 0.50 packs/day for 7 years    Types: Cigarettes, Cigars    Quit date: 02/20/1956  . Smokeless tobacco: Never Used  . Alcohol Use: Yes     Comment: 2-3 drinks per week  . Drug Use: No  . Sexually Active: Yes   Other Topics Concern  . Not on file   Social History Narrative  . No narrative on file     Allergies  Allergen Reactions  . Nickel     Via an allergy test     Outpatient Prescriptions  Prior to Visit  Medication Sig Dispense Refill  . allopurinol (ZYLOPRIM) 100 MG tablet Take 100 mg by mouth daily.      [name] atorvastatin (LIPITOR) 40 MG tablet Take 40 mg by mouth daily.      . valsartan-hydrochlorothiazide (DIOVAN-HCT) 160-12.5 MG per tablet Take 1 tablet by mouth daily.       Facility-Administered Medications Prior to Visit  Medication Dose Route Frequency Provider Last Rate Last Dose  . neomycin-polymyxin-dexameth (MAXITROL) 0.1 % ophth ointment    PRN Gemma Payor, MD   1 application at 12/18/11 1205       Review of Systems  Constitutional: Negative for fever, chills, weight loss, diaphoresis, activity change, appetite change, fatigue and unexpected weight change.  HENT: Positive for hearing loss, congestion, rhinorrhea, sneezing, trouble swallowing, voice change, postnasal drip and tinnitus. Negative for ear pain, nosebleeds, sore throat, facial swelling, mouth sores, neck pain, neck stiffness, dental problem, sinus pressure and ear discharge.   Eyes: Negative for photophobia, discharge, itching and visual disturbance.  Respiratory: Positive for cough. Negative for apnea, hemoptysis, choking, chest tightness, shortness of breath, wheezing and stridor.   Cardiovascular: Negative for chest pain, palpitations and leg swelling.  Gastrointestinal: Negative for heartburn, nausea, vomiting, abdominal pain, constipation, blood in stool and abdominal distention.  Genitourinary: Negative for dysuria, urgency,  frequency, hematuria, flank pain, decreased urine volume and difficulty urinating.  Musculoskeletal: Negative for myalgias, back pain, joint swelling, arthralgias and gait problem.  Skin: Negative for color change, pallor and rash.  Allergic/Immunologic: Negative for environmental allergies.  Neurological: Positive for dizziness. Negative for tremors, seizures, syncope, speech difficulty, weakness, light-headedness, numbness and headaches.  Hematological: Negative for  adenopathy. Bruises/bleeds easily.  Psychiatric/Behavioral: Negative for confusion, sleep disturbance and agitation. The patient is not nervous/anxious.        Objective:   Physical Exam Filed Vitals:   07/25/12 0928  BP: 132/72  Pulse: 74  Temp: 98.3 F (36.8 C)  TempSrc: Oral  Height: 5\' 8"  (1.727 m)  Weight: 159 lb (72.122 kg)  SpO2: 98%    Gen: Pleasant, well-nourished, in no distress,  normal affect  ENT: No lesions,  mouth clear,  oropharynx clear, presence the postnasal drip  Neck: No JVD, no TMG, no carotid bruits  Lungs: No use of accessory muscles, no dullness to percussion, clear without rales or rhonchi  Cardiovascular: RRR, heart sounds normal, no murmur or gallops, no peripheral edema  Abdomen: soft and NT, no HSM,  BS normal  Musculoskeletal: No deformities, no cyanosis or clubbing  Neuro: alert, non focal  Skin: Warm, no lesions or rashes  No results found.        Assessment & Plan:   Multiple pulmonary nodules Multiple micronodules with 2 larger nodules no larger than 4 mm in left upper lobe and left lower lobe noncalcified and of no consequence these are likely granulomas Plan Repeat CT scan of chest in 1 year No additional workup indicated Note the cough the patient is experiencing is likely from postnasal drip syndrome   Updated Medication List Outpatient Encounter Prescriptions as of 07/25/2012  Medication Sig Dispense Refill  . allopurinol (ZYLOPRIM) 100 MG tablet Take 100 mg by mouth daily.      [name] atorvastatin (LIPITOR) 40 MG tablet Take 40 mg by mouth daily.      . clobetasol ointment (TEMOVATE) 0.05 % Apply 1 application topically as needed.      . Coenzyme Q10 (COQ10 PO) Take 1 capsule by mouth daily.      . fluocinonide (LIDEX) 0.05 % external solution Apply 1 application topically at bedtime.      [name] MILK THISTLE PO Take 1 capsule by mouth daily.      . Multiple Vitamin (MULTIVITAMIN) tablet Take 1 tablet by mouth daily.      .  Omega-3 Fatty Acids (OMEGA 3 PO) Take 1 capsule by mouth daily.      . Probiotic Product (PROBIOTIC DAILY PO) Take 1 capsule by mouth daily.      . TURMERIC PO Take 1 capsule by mouth 2 (two) times daily.      . valsartan-hydrochlorothiazide (DIOVAN-HCT) 320-25 MG per tablet Take 0.5 tablets by mouth daily.      . [DISCONTINUED] valsartan-hydrochlorothiazide (DIOVAN-HCT) 160-12.5 MG per tablet Take 1 tablet by mouth daily.       Facility-Administered Encounter Medications as of 07/25/2012  Medication Dose Route Frequency Provider Last Rate Last Dose  . neomycin-polymyxin-dexameth (MAXITROL) 0.1 % ophth ointment    PRN Gemma Payor, MD   1 application at 12/18/11 1205

## 2012-08-08 ENCOUNTER — Ambulatory Visit: Payer: Self-pay | Admitting: Family Medicine

## 2012-08-21 ENCOUNTER — Encounter: Payer: Self-pay | Admitting: Family Medicine

## 2012-08-21 ENCOUNTER — Ambulatory Visit (INDEPENDENT_AMBULATORY_CARE_PROVIDER_SITE_OTHER): Payer: Medicare Other | Admitting: Family Medicine

## 2012-08-21 VITALS — BP 107/58 | HR 76 | Temp 97.6°F | Ht 67.0 in | Wt 162.2 lb

## 2012-08-21 DIAGNOSIS — E559 Vitamin D deficiency, unspecified: Secondary | ICD-10-CM

## 2012-08-21 DIAGNOSIS — M109 Gout, unspecified: Secondary | ICD-10-CM

## 2012-08-21 DIAGNOSIS — R5383 Other fatigue: Secondary | ICD-10-CM

## 2012-08-21 DIAGNOSIS — R5381 Other malaise: Secondary | ICD-10-CM

## 2012-08-21 DIAGNOSIS — N400 Enlarged prostate without lower urinary tract symptoms: Secondary | ICD-10-CM

## 2012-08-21 DIAGNOSIS — I10 Essential (primary) hypertension: Secondary | ICD-10-CM

## 2012-08-21 DIAGNOSIS — R918 Other nonspecific abnormal finding of lung field: Secondary | ICD-10-CM

## 2012-08-21 DIAGNOSIS — E785 Hyperlipidemia, unspecified: Secondary | ICD-10-CM

## 2012-08-21 NOTE — Progress Notes (Signed)
  Subjective:    Patient ID: Brian Blankenship, male    DOB: [birth year], 79 y.o.   MRN: [id number]  HPI Patient returns to clinic today for followup of chronic medical problems. These include hypertension, hyperlipidemia, psoriasis, and an abnormal chest CT which had many small pulmonary nodules. Early in June he saw a pulmonary specialist at [facility]. He was reassured that the pulmonary nodules were not to be worried about. And that he should have a repeat CT in May of 2015. Patient's blood pressures have been running in the 120s over the 60s.   Review of Systems  Constitutional: Positive for fatigue (slight).  HENT: Positive for sneezing and postnasal drip. Negative for ear pain and sore throat.   Eyes: Negative.   Respiratory: Positive for cough (dry) and choking (when drinking).   Cardiovascular: Negative.   Gastrointestinal: Negative.   Genitourinary: Positive for frequency (at night).  Musculoskeletal: Negative.   Skin: Negative.   Allergic/Immunologic: Positive for environmental allergies.  Neurological: Positive for dizziness (positional).  Psychiatric/Behavioral: Negative.        Objective:   Physical Exam BP 107/58  Pulse 76  Temp(Src) 97.6 F (36.4 C) (Oral)  Ht 5\' 7"  (1.702 m)  Wt 162 lb 3.2 oz (73.573 kg)  BMI 25.4 kg/m2  The patient appeared well nourished and normally developed, alert and oriented to time and place. Speech, behavior and judgement appear normal. Vital signs as documented.  Head exam is unremarkable. No scleral icterus or pallor noted. Normal nasal congestion on the left side. He has upper and lower dentures in place . Ears were normal Neck is without jugular venous distension, thyromegally, or carotid bruits. Carotid upstrokes are brisk bilaterally. No cervical adenopathy. Lungs are clear anteriorly and posteriorly to auscultation. Normal respiratory effort. Cardiac exam reveals regular rate and rhythm at 84 per minute. First and second heart sounds  normal.  No murmurs, rubs or gallops.  Abdominal exam reveals normal bowl sounds, no masses, no organomegaly and no aortic enlargement. No inguinal adenopathy. No abdominal tenderness. Extremities are nonedematous and both femoral and pedal pulses are normal. Skin without pallor or jaundice.  Warm and dry, with minimal psoriatic rash. Neurologic exam reveals normal deep tendon reflexes and normal sensation.          Assessment & Plan:  1. Hypertension  2. Hyperlipemia - NMR Lipoprofile with Lipids; Standing - Hepatic function panel; Standing  3. BPH (benign prostatic hypertrophy)  4. BPH (benign prostatic hyperplasia)  5. HTN (hypertension) - BASIC METABOLIC PANEL WITH GFR; Standing  6. Multiple pulmonary nodules - CT Chest W Wo Contrast; Future  7. Fatigue - POCT CBC; Standing - Thyroid Panel With TSH; Standing  8. Vitamin D deficiency - Vitamin D 25 hydroxy; Standing  9. Gout - Uric acid  10. Warty lesion on chest wall -Arrange for removal and path   Patient Instructions  Fall precautions discussed Continue current meds and therapeutic lifestyle changes We will arrange for Dr. Alonza Bogus to see you As a reminder you would need another chest CT in May of 2015 We will call you when we get the lab work back

## 2012-08-21 NOTE — Patient Instructions (Addendum)
Fall precautions discussed Continue current meds and therapeutic lifestyle changes We will arrange for Dr. Alonza Bogus to see you As a reminder you would need another chest CT in May of 2015 We will call you when we get the lab work back

## 2012-08-22 ENCOUNTER — Other Ambulatory Visit (INDEPENDENT_AMBULATORY_CARE_PROVIDER_SITE_OTHER): Payer: Medicare Other

## 2012-08-22 DIAGNOSIS — E785 Hyperlipidemia, unspecified: Secondary | ICD-10-CM

## 2012-08-22 DIAGNOSIS — I10 Essential (primary) hypertension: Secondary | ICD-10-CM

## 2012-08-22 DIAGNOSIS — E559 Vitamin D deficiency, unspecified: Secondary | ICD-10-CM

## 2012-08-22 DIAGNOSIS — R5381 Other malaise: Secondary | ICD-10-CM

## 2012-08-22 DIAGNOSIS — R5383 Other fatigue: Secondary | ICD-10-CM

## 2012-08-22 LAB — BASIC METABOLIC PANEL WITH GFR
BUN: 13 mg/dL (ref 6–23)
CO2: 29 mEq/L (ref 19–32)
Calcium: 9.3 mg/dL (ref 8.4–10.5)
Creat: 1.32 mg/dL (ref 0.50–1.35)
Glucose, Bld: 94 mg/dL (ref 70–99)
Sodium: 142 mEq/L (ref 135–145)

## 2012-08-22 LAB — POCT CBC
Lymph, poc: 3.7 — AB (ref 0.6–3.4)
MCH, POC: 33.2 pg — AB (ref 27–31.2)
MCHC: 35.8 g/dL — AB (ref 31.8–35.4)
MPV: 8.5 fL (ref 0–99.8)
Platelet Count, POC: 135 10*3/uL — AB (ref 142–424)
WBC: 4.9 10*3/uL (ref 4.6–10.2)

## 2012-08-22 LAB — THYROID PANEL WITH TSH: T4, Total: 6.4 ug/dL (ref 5.0–12.5)

## 2012-08-22 LAB — HEPATIC FUNCTION PANEL
ALT: 19 U/L (ref 0–53)
AST: 24 U/L (ref 0–37)
Bilirubin, Direct: 0.2 mg/dL (ref 0.0–0.3)

## 2012-08-22 NOTE — Progress Notes (Signed)
Pt came in for labs only 

## 2012-08-26 LAB — NMR LIPOPROFILE WITH LIPIDS
HDL Size: 9.1 nm — ABNORMAL LOW (ref >=9.2)
LDL Size: 19.9 nm — ABNORMAL LOW (ref >20.5)
Large HDL-P: 7 umol/L (ref >=4.8)
Large VLDL-P: 2.5 nmol/L (ref <=2.7)
Small LDL Particle Number: 593 nmol/L — ABNORMAL HIGH (ref <=527)

## 2012-08-28 ENCOUNTER — Telehealth: Payer: Self-pay | Admitting: Family Medicine

## 2012-08-29 ENCOUNTER — Encounter: Payer: Self-pay | Admitting: *Deleted

## 2012-08-30 ENCOUNTER — Other Ambulatory Visit: Payer: Medicare Other

## 2012-08-30 DIAGNOSIS — R7989 Other specified abnormal findings of blood chemistry: Secondary | ICD-10-CM

## 2012-08-30 LAB — URIC ACID: Uric Acid, Serum: 6.8 mg/dL — ABNORMAL HIGH (ref 4.0–6.0)

## 2012-08-30 NOTE — Progress Notes (Signed)
Brian Blankenship is coming in for a uric acid to be drawn we could not add it on

## 2012-09-04 NOTE — Telephone Encounter (Signed)
Patient came in on 08-30-12 for Uric acid labs.

## 2012-09-04 NOTE — Telephone Encounter (Signed)
Discussed results and recommendations with patient.   

## 2013-02-24 ENCOUNTER — Encounter: Payer: Self-pay | Admitting: Family Medicine

## 2013-02-24 ENCOUNTER — Ambulatory Visit (INDEPENDENT_AMBULATORY_CARE_PROVIDER_SITE_OTHER): Payer: Medicare HMO | Admitting: Family Medicine

## 2013-02-24 VITALS — BP 137/64 | HR 72 | Temp 97.5°F | Ht 67.0 in | Wt 162.0 lb

## 2013-02-24 DIAGNOSIS — E559 Vitamin D deficiency, unspecified: Secondary | ICD-10-CM

## 2013-02-24 DIAGNOSIS — E785 Hyperlipidemia, unspecified: Secondary | ICD-10-CM

## 2013-02-24 DIAGNOSIS — Z23 Encounter for immunization: Secondary | ICD-10-CM

## 2013-02-24 DIAGNOSIS — N400 Enlarged prostate without lower urinary tract symptoms: Secondary | ICD-10-CM

## 2013-02-24 DIAGNOSIS — I10 Essential (primary) hypertension: Secondary | ICD-10-CM

## 2013-02-24 DIAGNOSIS — M109 Gout, unspecified: Secondary | ICD-10-CM

## 2013-02-24 LAB — POCT CBC
Granulocyte percent: 75.9 %G (ref 37–80)
HCT, POC: 46.5 % (ref 43.5–53.7)
HEMOGLOBIN: 15.3 g/dL (ref 14.1–18.1)
LYMPH, POC: 1.3 (ref 0.6–3.4)
MCH: 30.4 pg (ref 27–31.2)
MCHC: 32.8 g/dL (ref 31.8–35.4)
MCV: 92.6 fL (ref 80–97)
MPV: 8.3 fL (ref 0–99.8)
POC Granulocyte: 4.9 (ref 2–6.9)
POC LYMPH %: 20.2 % (ref 10–50)
Platelet Count, POC: 165 10*3/uL (ref 142–424)
RBC: 5 M/uL (ref 4.69–6.13)
RDW, POC: 13.6 %
WBC: 6.5 10*3/uL (ref 4.6–10.2)

## 2013-02-24 NOTE — Progress Notes (Signed)
Subjective:    Patient ID: Brian Blankenship, male    DOB: 10/24/1930, 80 y.o.   MRN: [id number]  HPI Pt here for follow up and management of chronic medical problems. Patient will have lab work today. He is due to get his prostate and PSA done by Dr. Rosana Hoes in April. He will be given FOBT today. He will receive a Prevnar today. He is due to have a repeat CT scan of the lungs because of pulmonary nodules. This will be done around May 1. He does complain of some numbness in his hand and some dizziness with standing. Home blood pressures were brought in for review and all of these were good.        Patient Active Problem List   Diagnosis Date Noted  . Multiple pulmonary nodules 07/25/2012  . ED (erectile dysfunction)   . Hyperlipidemia   . BPH (benign prostatic hyperplasia)   . HTN (hypertension)   . Diverticulosis   . Psoriasis    Outpatient Encounter Prescriptions as of 02/24/2013  Medication Sig  . allopurinol (ZYLOPRIM) 100 MG tablet Take 100 mg by mouth daily.  [name] atorvastatin (LIPITOR) 40 MG tablet Take 40 mg by mouth daily.  . clobetasol ointment (TEMOVATE) 1.51 % Apply 1 application topically as needed.  . Coenzyme Q10 (COQ10 PO) Take 1 capsule by mouth daily.  . fluocinonide (LIDEX) 0.05 % external solution Apply 1 application topically at bedtime.  [name] MILK THISTLE PO Take 1 capsule by mouth daily.  . Multiple Vitamin (MULTIVITAMIN) tablet Take 1 tablet by mouth daily.  . Omega-3 Fatty Acids (OMEGA 3 PO) Take 1 capsule by mouth daily.  . Probiotic Product (PROBIOTIC DAILY PO) Take 1 capsule by mouth daily.  . TURMERIC PO Take 1 capsule by mouth 2 (two) times daily.  . valsartan-hydrochlorothiazide (DIOVAN-HCT) 320-25 MG per tablet Take 0.5 tablets by mouth daily.    Review of Systems  Constitutional: Negative.   HENT: Negative.   Eyes: Negative.   Respiratory: Negative.   Cardiovascular: Negative.   Gastrointestinal: Negative.   Endocrine: Negative.   Genitourinary:  Negative.   Musculoskeletal: Negative.   Skin: Negative.   Allergic/Immunologic: Negative.   Neurological: Positive for dizziness (worse with standing) and numbness (hand numbness at night).  Hematological: Negative.   Psychiatric/Behavioral: Negative.        Objective:   Physical Exam  Nursing note and vitals reviewed. Constitutional: He is oriented to person, place, and time. He appears well-developed and well-nourished. No distress.  HENT:  Head: Normocephalic and atraumatic.  Right Ear: External ear normal.  Left Ear: External ear normal.  Nose: Nose normal.  Mouth/Throat: Oropharynx is clear and moist. No oropharyngeal exudate.  Patient has upper and lower dentures in place  Eyes: Conjunctivae and EOM are normal. Pupils are equal, round, and reactive to light. Right eye exhibits no discharge. Left eye exhibits no discharge. No scleral icterus.  Neck: Normal range of motion. Neck supple. No thyromegaly present.  No carotid bruits  Cardiovascular: Normal rate, regular rhythm, normal heart sounds and intact distal pulses.  Exam reveals no gallop and no friction rub.   No murmur heard. At 72 per minute  Pulmonary/Chest: Effort normal and breath sounds normal. No respiratory distress. He has no wheezes. He has no rales. He exhibits no tenderness.  Abdominal: Soft. Bowel sounds are normal. He exhibits no distension and no mass. There is no tenderness. There is no rebound and no guarding.  Musculoskeletal: Normal range of motion.  He exhibits no edema and no tenderness.  Lymphadenopathy:    He has no cervical adenopathy.  Neurological: He is alert and oriented to person, place, and time. He has normal reflexes. No cranial nerve deficit.  Skin: Skin is warm and dry. No rash noted. No erythema. No pallor.  Patient has some sparsely scattered areas of psoriasis which is typical for him   Psychiatric: He has a normal mood and affect. His behavior is normal. Judgment and thought content  normal.   BP 137/64  Pulse 72  Temp(Src) 97.5 F (36.4 C) (Oral)  Ht _0  (1.702 m)  Wt 162 lb (73.483 kg)  BMI 25.37 kg/m2        Assessment & Plan:  1. BPH (benign prostatic hyperplasia) - POCT CBC  2. HTN (hypertension) - POCT CBC - BMP8+EGFR - Hepatic function panel  3. Hyperlipidemia - POCT CBC - NMR, lipoprofile  4. Vitamin D deficiency - Vit D  25 hydroxy (rtn osteoporosis monitoring)  5. Gout - Uric acid  Orders Placed This Encounter  Procedures  . BMP8+EGFR  . Hepatic function panel  . NMR, lipoprofile  . Vit D  25 hydroxy (rtn osteoporosis monitoring)  . Uric acid  . POCT CBC   No orders of the defined types were placed in this encounter.   Patient Instructions  Continue current medications. Continue good therapeutic lifestyle changes which include good diet and exercise. Fall precautions discussed with patient. Schedule your flu vaccine if you haven't had it yet If you are over 78 years old - you may need Prevnar 62 or the adult Pneumonia vaccine. You will receive a Prevnar vaccine today, and this may make her arms sore for 2-3 days. Please purchase a wrist brace to wear on your hands to see if this may help some of the numbness you're having in them. Just wear the wrist brace at nighttime. Monitor blood pressures especially when dizzy spells are noted and bring readings by to the office for review in a couple weeks That your CT scan is due to an early May of your lungs. This is a followup from one year ago for the pulmonary nodules. Continue with appointments with Dr. Lyman Speller, Dr. Rosana Hoes, Dr.Hoyle, and Dr. Joya Gaskins as planned   Arrie Senate MD

## 2013-02-24 NOTE — Patient Instructions (Addendum)
Continue current medications. Continue good therapeutic lifestyle changes which include good diet and exercise. Fall precautions discussed with patient. Schedule your flu vaccine if you haven't had it yet If you are over 78 years old - you may need Prevnar 33 or the adult Pneumonia vaccine. You will receive a Prevnar vaccine today, and this may make her arms sore for 2-3 days. Please purchase a wrist brace to wear on your hands to see if this may help some of the numbness you're having in them. Just wear the wrist brace at nighttime. Monitor blood pressures especially when dizzy spells are noted and bring readings by to the office for review in a couple weeks That your CT scan is due to an early May of your lungs. This is a followup from one year ago for the pulmonary nodules. Continue with appointments with Dr. Lyman Speller, Dr. Rosana Hoes, Dr.Hoyle, and Dr. Joya Gaskins as planned

## 2013-02-24 NOTE — Addendum Note (Signed)
Addended by: Zannie Cove on: 02/24/2013 10:19 AM   Modules accepted: Orders

## 2013-02-25 ENCOUNTER — Other Ambulatory Visit: Payer: Medicare HMO

## 2013-02-25 LAB — VITAMIN D 25 HYDROXY (VIT D DEFICIENCY, FRACTURES): Vit D, 25-Hydroxy: 33.1 ng/mL (ref 30.0–100.0)

## 2013-02-25 LAB — NMR, LIPOPROFILE
Cholesterol: 129 mg/dL (ref <200)
HDL Cholesterol by NMR: 55 mg/dL (ref >=40)
HDL Particle Number: 35.9 umol/L (ref >=30.5)
LDL PARTICLE NUMBER: 818 nmol/L (ref <1000)
LDL SIZE: 20.8 nm (ref >20.5)
LDLC SERPL CALC-MCNC: 61 mg/dL (ref <100)
SMALL LDL PARTICLE NUMBER: 341 nmol/L (ref <=527)
Triglycerides by NMR: 66 mg/dL (ref <150)

## 2013-02-25 LAB — BMP8+EGFR
BUN / CREAT RATIO: 7 — AB (ref 10–22)
BUN: 11 mg/dL (ref 8–27)
CO2: 26 mmol/L (ref 18–29)
CREATININE: 1.52 mg/dL — AB (ref 0.76–1.27)
Calcium: 9.5 mg/dL (ref 8.6–10.2)
Chloride: 102 mmol/L (ref 97–108)
GFR calc Af Amer: 49 mL/min/{1.73_m2} — ABNORMAL LOW (ref >59)
GFR, EST NON AFRICAN AMERICAN: 43 mL/min/{1.73_m2} — AB (ref >59)
Glucose: 102 mg/dL — ABNORMAL HIGH (ref 65–99)
POTASSIUM: 4.3 mmol/L (ref 3.5–5.2)
SODIUM: 143 mmol/L (ref 134–144)

## 2013-02-25 LAB — HEPATIC FUNCTION PANEL
ALK PHOS: 82 IU/L (ref 39–117)
ALT: 25 IU/L (ref 0–44)
AST: 22 IU/L (ref 0–40)
Albumin: 4.7 g/dL (ref 3.5–4.7)
BILIRUBIN DIRECT: 0.24 mg/dL (ref 0.00–0.40)
BILIRUBIN TOTAL: 0.9 mg/dL (ref 0.0–1.2)
Total Protein: 6.6 g/dL (ref 6.0–8.5)

## 2013-02-25 LAB — URIC ACID: Uric Acid: 6.7 mg/dL (ref 3.7–8.6)

## 2013-02-25 NOTE — Progress Notes (Signed)
Pt came in for labs only 

## 2013-02-26 LAB — FECAL OCCULT BLOOD, IMMUNOCHEMICAL: Fecal Occult Bld: NEGATIVE

## 2013-03-03 ENCOUNTER — Encounter: Payer: Self-pay | Admitting: *Deleted

## 2013-03-03 NOTE — Progress Notes (Signed)
Quick Note:  Copy of labs sent to patient ______ 

## 2013-03-21 ENCOUNTER — Ambulatory Visit (INDEPENDENT_AMBULATORY_CARE_PROVIDER_SITE_OTHER): Payer: Medicare HMO

## 2013-03-21 ENCOUNTER — Telehealth: Payer: Self-pay | Admitting: General Practice

## 2013-03-21 ENCOUNTER — Ambulatory Visit (INDEPENDENT_AMBULATORY_CARE_PROVIDER_SITE_OTHER): Payer: Medicare HMO | Admitting: General Practice

## 2013-03-21 VITALS — BP 127/66 | HR 78 | Temp 98.3°F | Ht 67.0 in | Wt 159.0 lb

## 2013-03-21 DIAGNOSIS — Z87442 Personal history of urinary calculi: Secondary | ICD-10-CM

## 2013-03-21 DIAGNOSIS — R319 Hematuria, unspecified: Secondary | ICD-10-CM

## 2013-03-21 DIAGNOSIS — L409 Psoriasis, unspecified: Secondary | ICD-10-CM

## 2013-03-21 DIAGNOSIS — L408 Other psoriasis: Secondary | ICD-10-CM

## 2013-03-21 LAB — POCT URINALYSIS DIPSTICK
BILIRUBIN UA: NEGATIVE
GLUCOSE UA: NEGATIVE
Ketones, UA: NEGATIVE
Leukocytes, UA: NEGATIVE
Nitrite, UA: NEGATIVE
Protein, UA: NEGATIVE
SPEC GRAV UA: 1.01
Urobilinogen, UA: NEGATIVE
pH, UA: 7

## 2013-03-21 NOTE — Telephone Encounter (Signed)
Spoke with patient and he verbalized having another episode of blood in urine. He spoke with his urology office and has an appointment scheduled for Thursday, Feb. 5, 2015. He will need copy of his abdominal xray. Patient to come by office and pick up copy of xray (Monday) prior to visit with urology.

## 2013-03-21 NOTE — Telephone Encounter (Signed)
Had a small amount of blood and clott this morning after he saw you is very concerned and would like to speak to you his self

## 2013-03-21 NOTE — Patient Instructions (Signed)
Hematuria, Adult °Hematuria is blood in your urine. It can be caused by a bladder infection, kidney infection, prostate infection, kidney stone, or cancer of your urinary tract. Infections can usually be treated with medicine, and a kidney stone usually will pass through your urine. If neither of these is the cause of your hematuria, further workup to find out the reason may be needed. °It is very important that you tell your health care provider about any blood you see in your urine, even if the blood stops without treatment or happens without causing pain. Blood in your urine that happens and then stops and then happens again can be a symptom of a very serious condition. Also, pain is not a symptom in the initial stages of many urinary cancers. °HOME CARE INSTRUCTIONS  °· Drink lots of fluid, 3 4 quarts a day. If you have been diagnosed with an infection, cranberry juice is especially recommended, in addition to large amounts of water. °· Avoid caffeine, tea, and carbonated beverages, because they tend to irritate the bladder. °· Avoid alcohol because it may irritate the prostate. °· Only take over-the-counter or prescription medicines for pain, discomfort, or fever as directed by your health care provider. °· If you have been diagnosed with a kidney stone, follow your health care provider's instructions regarding straining your urine to catch the stone. °· Empty your bladder often. Avoid holding urine for long periods of time. °· After a bowel movement, women should cleanse front to back. Use each tissue only once. °· Empty your bladder before and after sexual intercourse if you are a male. °SEEK MEDICAL CARE IF: °You develop back pain, fever, a feeling of sickness in your stomach (nausea), or vomiting or if your symptoms are not better in 3 days. Return sooner if you are getting worse. °SEEK IMMEDIATE MEDICAL CARE IF:  °· You have a persistent fever, with a temperature of 101.8°F (38.8°C) or greater. °· You  develop severe vomiting and are unable to keep the medicine down. °· You develop severe back or abdominal pain despite taking your medicines. °· You begin passing a large amount of blood or clots in your urine. °· You feel extremely weak or faint, or you pass out. °MAKE SURE YOU:  °· Understand these instructions. °· Will watch your condition. °· Will get help right away if you are not doing well or get worse. °Document Released: 02/06/2005 Document Revised: 11/27/2012 Document Reviewed: 10/07/2012 °[employer]® Patient Information ©2014 [employer]. ° °

## 2013-03-21 NOTE — Progress Notes (Signed)
   Subjective:    Patient ID: Brian Blankenship, male    DOB: 1930/06/14, 80 y.o.   MRN: [id number]  Hematuria This is a new problem. The current episode started yesterday. The problem has been resolved since onset. He describes the hematuria as gross hematuria. The hematuria occurs during the initial portion of his urinary stream. He reports no clotting in his urine stream. His pain is at a severity of 0/10. He is experiencing no pain. He describes his urine color as clear. Irritative symptoms do not include frequency, nocturia or urgency. Obstructive symptoms do not include dribbling. Pertinent negatives include no abdominal pain, chills, dysuria, fever, flank pain, hesitancy, inability to urinate, nausea or vomiting. His past medical history is significant for BPH and kidney stones. There is no history of recent infection.  Denies having blood in urine since yesterday. Reports psoriasis and would like to be seen by his dermatologist in [facility].      Review of Systems  Constitutional: Negative for fever and chills.  Respiratory: Negative for chest tightness and shortness of breath.   Cardiovascular: Negative for chest pain and palpitations.  Gastrointestinal: Negative for nausea, vomiting, abdominal pain, diarrhea, constipation and blood in stool.  Genitourinary: Positive for hematuria. Negative for dysuria, hesitancy, urgency, frequency, flank pain, decreased urine volume, discharge, penile pain, testicular pain and nocturia.  Neurological: Negative for dizziness, weakness and headaches.       Objective:   Physical Exam  Constitutional: He is oriented to person, place, and time. He appears well-developed and well-nourished.  Cardiovascular: Normal rate, regular rhythm and normal heart sounds.   Pulmonary/Chest: Effort normal and breath sounds normal. No respiratory distress. He exhibits no tenderness.  Abdominal: Soft. Bowel sounds are normal. He exhibits no distension. There is no  tenderness.  Neurological: He is alert and oriented to person, place, and time.  Skin: Skin is warm and dry.  Psychiatric: He has a normal mood and affect.    [facility] reading (PRIMARY) by Erby Pian, FNP-C, no acute change.       Assessment & Plan:  1. Hematuria  - POCT urinalysis dipstick - DG Abd 1 View - Ambulatory referral to Urology  2. History of kidney stones  - Ambulatory referral to Urology  3. Psoriasis  - Ambulatory referral to Dermatology -increase fluids -Miralax 17grams daily, for 1-4 days, until bowel movement  Increase fluid intake (water) Increase fiber in diet (fruits, vegetables, whole grains) Take stool softner daily -RTO if symptoms worsen or unresolved -Patient verbalized understanding Erby Pian, FNP-C

## 2013-03-31 ENCOUNTER — Telehealth: Payer: Self-pay | Admitting: Family Medicine

## 2013-04-01 NOTE — Telephone Encounter (Signed)
[facility] to [phone]  Pt already has appointment scheduled with Dr. Lyman Speller for 05/19/13 at 11am

## 2013-04-14 ENCOUNTER — Telehealth: Payer: Self-pay | Admitting: Family Medicine

## 2013-04-14 NOTE — Telephone Encounter (Signed)
Please take care of these referrals and let patient know that this has been done

## 2013-05-28 ENCOUNTER — Other Ambulatory Visit: Payer: Self-pay | Admitting: *Deleted

## 2013-05-28 DIAGNOSIS — R911 Solitary pulmonary nodule: Secondary | ICD-10-CM

## 2013-06-02 ENCOUNTER — Other Ambulatory Visit: Payer: Self-pay | Admitting: Family Medicine

## 2013-06-04 ENCOUNTER — Other Ambulatory Visit: Payer: Medicare Other

## 2013-06-05 ENCOUNTER — Other Ambulatory Visit: Payer: Self-pay | Admitting: Family Medicine

## 2013-06-06 ENCOUNTER — Ambulatory Visit (HOSPITAL_COMMUNITY): Payer: Commercial Managed Care - HMO

## 2013-06-06 NOTE — Telephone Encounter (Signed)
Last OV 02-24-13. Please advise

## 2013-06-09 ENCOUNTER — Ambulatory Visit (HOSPITAL_COMMUNITY)
Admission: RE | Admit: 2013-06-09 | Discharge: 2013-06-09 | Disposition: A | Discharge status: Home / Self Care | Payer: Medicare HMO | Source: Ambulatory Visit | Attending: Family Medicine | Admitting: Family Medicine

## 2013-06-09 DIAGNOSIS — R911 Solitary pulmonary nodule: Secondary | ICD-10-CM | POA: Insufficient documentation

## 2013-06-11 ENCOUNTER — Other Ambulatory Visit (INDEPENDENT_AMBULATORY_CARE_PROVIDER_SITE_OTHER): Payer: Medicare HMO

## 2013-06-11 DIAGNOSIS — E559 Vitamin D deficiency, unspecified: Secondary | ICD-10-CM

## 2013-06-11 DIAGNOSIS — R5383 Other fatigue: Secondary | ICD-10-CM

## 2013-06-11 DIAGNOSIS — R5381 Other malaise: Secondary | ICD-10-CM

## 2013-06-11 DIAGNOSIS — I10 Essential (primary) hypertension: Secondary | ICD-10-CM

## 2013-06-11 DIAGNOSIS — E785 Hyperlipidemia, unspecified: Secondary | ICD-10-CM

## 2013-06-11 LAB — BASIC METABOLIC PANEL WITH GFR
BUN: 19 mg/dL (ref 6–23)
CHLORIDE: 103 meq/L (ref 96–112)
CO2: 28 mEq/L (ref 19–32)
Calcium: 9.1 mg/dL (ref 8.4–10.5)
Creat: 1.1 mg/dL (ref 0.50–1.35)
GFR, EST NON AFRICAN AMERICAN: 63 mL/min
GFR, Est African American: 73 mL/min
Glucose, Bld: 98 mg/dL (ref 70–99)
POTASSIUM: 4.4 meq/L (ref 3.5–5.3)
Sodium: 140 mEq/L (ref 135–145)

## 2013-06-11 LAB — POCT CBC
GRANULOCYTE PERCENT: 76.4 % (ref 37–80)
HEMATOCRIT: 44.3 % (ref 43.5–53.7)
HEMOGLOBIN: 14.3 g/dL (ref 14.1–18.1)
Lymph, poc: 0.9 (ref 0.6–3.4)
MCH, POC: 30.4 pg (ref 27–31.2)
MCHC: 32.3 g/dL (ref 31.8–35.4)
MCV: 94.2 fL (ref 80–97)
MPV: 8.8 fL (ref 0–99.8)
POC GRANULOCYTE: 3.4 (ref 2–6.9)
POC LYMPH PERCENT: 20.4 %L (ref 10–50)
Platelet Count, POC: 156 10*3/uL (ref 142–424)
RBC: 4.7 M/uL (ref 4.69–6.13)
RDW, POC: 13.7 %
WBC: 4.4 10*3/uL — AB (ref 4.6–10.2)

## 2013-06-11 LAB — HEPATIC FUNCTION PANEL
ALBUMIN: 4.2 g/dL (ref 3.5–5.2)
ALT: 20 U/L (ref 0–53)
AST: 24 U/L (ref 0–37)
Alkaline Phosphatase: 65 U/L (ref 39–117)
BILIRUBIN INDIRECT: 0.7 mg/dL (ref 0.2–1.2)
Bilirubin, Direct: 0.3 mg/dL (ref 0.0–0.3)
Total Bilirubin: 1 mg/dL (ref 0.2–1.2)
Total Protein: 6.4 g/dL (ref 6.0–8.3)

## 2013-06-11 NOTE — Progress Notes (Signed)
Pt came in for lab  only 

## 2013-06-12 LAB — NMR LIPOPROFILE WITH LIPIDS
CHOLESTEROL, TOTAL: 116 mg/dL (ref <200)
HDL PARTICLE NUMBER: 34.8 umol/L (ref >=30.5)
HDL Size: 9.8 nm (ref >=9.2)
HDL-C: 51 mg/dL (ref >=40)
LDL CALC: 57 mg/dL (ref <100)
LDL PARTICLE NUMBER: 576 nmol/L (ref <1000)
LDL Size: 20 nm — ABNORMAL LOW (ref >20.5)
LP-IR Score: <25 (ref <=45)
Large HDL-P: 9.7 umol/L (ref >=4.8)
Large VLDL-P: 0.9 nmol/L (ref <=2.7)
Small LDL Particle Number: 367 nmol/L (ref <=527)
TRIGLYCERIDES: 41 mg/dL (ref <150)
VLDL SIZE: 45.1 nm (ref <=46.6)

## 2013-06-12 LAB — VITAMIN D 25 HYDROXY (VIT D DEFICIENCY, FRACTURES): VIT D 25 HYDROXY: 53 ng/mL (ref 30–89)

## 2013-06-16 ENCOUNTER — Telehealth: Payer: Self-pay | Admitting: Family Medicine

## 2013-06-16 NOTE — Telephone Encounter (Signed)
Message copied by Waverly Ferrari on Mon Jun 16, 2013  3:11 PM ------      Message from: Chipper Herb      Created: Thu Jun 12, 2013  3:12 PM       Cholesterol numbers are excellent and at goal, continue current treatment      The vitamin D level is excellent and at goal, continue current treatment      Electrolytes including potassium, blood sugar and creatinine, the most important kidney function tests are all within normal limits      All liver function tests are within normal ------

## 2013-06-16 NOTE — Telephone Encounter (Signed)
Patient aware.

## 2013-08-26 ENCOUNTER — Encounter: Payer: Self-pay | Admitting: Family Medicine

## 2013-08-26 ENCOUNTER — Ambulatory Visit (INDEPENDENT_AMBULATORY_CARE_PROVIDER_SITE_OTHER): Payer: Medicare HMO | Admitting: Family Medicine

## 2013-08-26 VITALS — BP 131/64 | HR 60 | Temp 97.1°F | Ht 67.0 in | Wt 155.8 lb

## 2013-08-26 DIAGNOSIS — M109 Gout, unspecified: Secondary | ICD-10-CM

## 2013-08-26 DIAGNOSIS — E785 Hyperlipidemia, unspecified: Secondary | ICD-10-CM

## 2013-08-26 DIAGNOSIS — I10 Essential (primary) hypertension: Secondary | ICD-10-CM

## 2013-08-26 DIAGNOSIS — M1040 Other secondary gout, unspecified site: Secondary | ICD-10-CM

## 2013-08-26 DIAGNOSIS — N400 Enlarged prostate without lower urinary tract symptoms: Secondary | ICD-10-CM

## 2013-08-26 DIAGNOSIS — E559 Vitamin D deficiency, unspecified: Secondary | ICD-10-CM

## 2013-08-26 LAB — POCT CBC
GRANULOCYTE PERCENT: 72.5 % (ref 37–80)
HCT, POC: 44.7 % (ref 43.5–53.7)
Hemoglobin: 14.9 g/dL (ref 14.1–18.1)
LYMPH, POC: 1.2 (ref 0.6–3.4)
MCH, POC: 31.6 pg — AB (ref 27–31.2)
MCHC: 33.3 g/dL (ref 31.8–35.4)
MCV: 95 fL (ref 80–97)
MPV: 9 fL (ref 0–99.8)
POC Granulocyte: 3.3 (ref 2–6.9)
POC LYMPH %: 25.2 % (ref 10–50)
Platelet Count, POC: 135 10*3/uL — AB (ref 142–424)
RBC: 4.7 M/uL (ref 4.69–6.13)
RDW, POC: 13.6 %
WBC: 4.6 10*3/uL (ref 4.6–10.2)

## 2013-08-26 NOTE — Progress Notes (Signed)
Subjective:    Patient ID: Brian Blankenship, male    DOB: January 13, 1931, 80 y.o.   MRN: [id number]  HPI Pt is here today for follow up on chronic medical problems to include, hypertension, hyperlipidemia, BPH and gout.the patient is doing well overall. He does complain of a cough. He has a history of multiple pulmonary nodules. He has seen a pulmonologist and he also sees a cardiologist. He is taking a statin drug for his cholesterol.He brings in his outside blood pressures for review and these will be scanned into the record here. He is due to have an eye exam. He is also due to be rechecked by his urologist and this will occur in September. He sees a cardiologist on a regular basis. He also sees the dermatologist in [place] for his psoriasis. He exercises regularly and eats healthy. He questions the need for atorvastatin. I told them we would defer this until we got his next lab work back.    Patient Active Problem List   Diagnosis Date Noted  . Gout 02/24/2013  . Multiple pulmonary nodules 07/25/2012  . ED (erectile dysfunction)   . Hyperlipidemia   . BPH (benign prostatic hyperplasia)   . HTN (hypertension)   . Diverticulosis   . Psoriasis    Outpatient Encounter Prescriptions as of 08/26/2013  Medication Sig  . allopurinol (ZYLOPRIM) 100 MG tablet TAKE (2) TABLETS DAILY  . atorvastatin (LIPITOR) 40 MG tablet Take 40 mg by mouth daily.  . clobetasol ointment (TEMOVATE) 0.05 % APPLY TO PSORIASIS TWICE DAILY  . Coenzyme Q10 (COQ10 PO) Take 1 capsule by mouth daily.  . fluocinonide (LIDEX) 0.05 % external solution APPLY TO SCALP AT BEDTIME  . MILK THISTLE PO Take 1 capsule by mouth daily.  . Multiple Vitamin (MULTIVITAMIN) tablet Take 1 tablet by mouth daily.  . Omega-3 Fatty Acids (OMEGA 3 PO) Take 1 capsule by mouth daily.  . Probiotic Product (PROBIOTIC DAILY PO) Take 1 capsule by mouth daily.  . valsartan-hydrochlorothiazide (DIOVAN-HCT) 320-25 MG per tablet TAKE 1 TABLET DAILY  .  [DISCONTINUED] TURMERIC PO Take 1 capsule by mouth 2 (two) times daily.   Patient Active Problem List   Diagnosis Date Noted  . Gout 02/24/2013  . Multiple pulmonary nodules 07/25/2012  . ED (erectile dysfunction)   . Hyperlipidemia   . BPH (benign prostatic hyperplasia)   . HTN (hypertension)   . Diverticulosis   . Psoriasis     Review of Systems  Constitutional: Negative for activity change and fatigue.  HENT: Negative.   Eyes: Negative.   Respiratory: Positive for cough. Negative for shortness of breath.   Cardiovascular: Negative for chest pain.  Endocrine: Negative.   Genitourinary: Negative.   Allergic/Immunologic: Negative.   Neurological: Negative.   Psychiatric/Behavioral: Negative.        Objective:   Physical Exam  Nursing note and vitals reviewed. Constitutional: He is oriented to person, place, and time. He appears well-developed and well-nourished. No distress.  HENT:  Head: Normocephalic and atraumatic.  Right Ear: External ear normal.  Left Ear: External ear normal.  Nose: Nose normal.  Mouth/Throat: Oropharynx is clear and moist. No oropharyngeal exudate.  Eyes: Conjunctivae and EOM are normal. Pupils are equal, round, and reactive to light. Right eye exhibits no discharge. Left eye exhibits no discharge. No scleral icterus.  Neck: Normal range of motion. Neck supple. No thyromegaly present.   No carotid bruit  Cardiovascular: Normal rate, regular rhythm, normal heart sounds and intact distal  pulses.  Exam reveals no gallop and no friction rub.   No murmur heard. At 72 per minute  Pulmonary/Chest: Effort normal and breath sounds normal. No respiratory distress. He has no wheezes. He has no rales. He exhibits no tenderness.  No axillary nodes  Abdominal: Soft. Bowel sounds are normal. He exhibits no mass. There is no tenderness. There is no rebound and no guarding.  No inguinal nodes or abdominal bruits  Genitourinary:  This exam was deferred to his  regular visits with the urologist and the next visit is scheduled in September 2015  Musculoskeletal: Normal range of motion. He exhibits no edema and no tenderness.  Lymphadenopathy:    He has no cervical adenopathy.  Neurological: He is alert and oriented to person, place, and time. He has normal reflexes. No cranial nerve deficit.  Skin: Skin is warm and dry. No rash noted. No erythema. No pallor.  Plaque psoriasis noted on trunk and legs  Psychiatric: He has a normal mood and affect. His behavior is normal. Judgment and thought content normal.   BP 131/64  Pulse 60  Temp(Src) 97.1 F (36.2 C) (Oral)  Ht 5' 7" (1.702 m)  Wt 155 lb 12.8 oz (70.67 kg)  BMI 24.40 kg/m2        Assessment & Plan:   1. Hyperlipemia - NMR, lipoprofile - BMP8+EGFR - Hepatic function panel  2. Vitamin D deficiency - Vit D  25 hydroxy (rtn osteoporosis monitoring) - BMP8+EGFR  3. Other secondary gout - POCT CBC - BMP8+EGFR  4. Essential hypertension - POCT CBC - BMP8+EGFR - Hepatic function panel  5. BPH (benign prostatic hypertrophy) - PSA, total and free  No orders of the defined types were placed in this encounter.   Patient Instructions  Continue current medications. Continue good therapeutic lifestyle changes which include good diet and exercise. Fall precautions discussed with patient. If an FOBT was given today- please return it to our front desk. If you are over 52 years old - you may need Prevnar 95 or the adult Pneumonia vaccine.  We will call you with the results of the lab work once those results are available Please keep your standing appointments with the urologist, the dermatologist, and the cardiologist. The CT scan of the lungs does not need to be repeated any further per the radiologist. Continue to exercise regularly this summer and drink plenty of fluids as to not get dehydrated. For now, continue the atorvastatin. Continue to monitor blood pressures at  home.   Brian Senate MD

## 2013-08-26 NOTE — Patient Instructions (Addendum)
Continue current medications. Continue good therapeutic lifestyle changes which include good diet and exercise. Fall precautions discussed with patient. If an FOBT was given today- please return it to our front desk. If you are over 78 years old - you may need Prevnar 53 or the adult Pneumonia vaccine.  We will call you with the results of the lab work once those results are available Please keep your standing appointments with the urologist, the dermatologist, and the cardiologist. The CT scan of the lungs does not need to be repeated any further per the radiologist. Continue to exercise regularly this summer and drink plenty of fluids as to not get dehydrated. For now, continue the atorvastatin. Continue to monitor blood pressures at home.

## 2013-08-27 LAB — BMP8+EGFR
BUN/Creatinine Ratio: 12 (ref 10–22)
BUN: 15 mg/dL (ref 8–27)
CALCIUM: 9.5 mg/dL (ref 8.6–10.2)
CO2: 28 mmol/L (ref 18–29)
Chloride: 103 mmol/L (ref 97–108)
Creatinine, Ser: 1.29 mg/dL — ABNORMAL HIGH (ref 0.76–1.27)
GFR calc Af Amer: 60 mL/min/{1.73_m2} (ref >59)
GFR calc non Af Amer: 52 mL/min/{1.73_m2} — ABNORMAL LOW (ref >59)
GLUCOSE: 102 mg/dL — AB (ref 65–99)
Potassium: 4.7 mmol/L (ref 3.5–5.2)
Sodium: 143 mmol/L (ref 134–144)

## 2013-08-27 LAB — PSA, TOTAL AND FREE
PSA, Free Pct: 31.6 %
PSA, Free: 0.98 ng/mL
PSA: 3.1 ng/mL (ref 0.0–4.0)

## 2013-08-27 LAB — HEPATIC FUNCTION PANEL
ALT: 21 IU/L (ref 0–44)
AST: 23 IU/L (ref 0–40)
Albumin: 4.5 g/dL (ref 3.5–4.7)
Alkaline Phosphatase: 68 IU/L (ref 39–117)
BILIRUBIN TOTAL: 1.1 mg/dL (ref 0.0–1.2)
Bilirubin, Direct: 0.28 mg/dL (ref 0.00–0.40)
TOTAL PROTEIN: 6.3 g/dL (ref 6.0–8.5)

## 2013-08-27 LAB — NMR, LIPOPROFILE
Cholesterol: 130 mg/dL (ref 100–199)
HDL CHOLESTEROL BY NMR: 65 mg/dL (ref >39)
HDL Particle Number: 37.5 umol/L (ref >=30.5)
LDL Particle Number: 585 nmol/L (ref <1000)
LDL Size: 20.7 nm (ref >20.5)
LDLC SERPL CALC-MCNC: 55 mg/dL (ref 0–99)
LP-IR Score: <25 (ref <=45)
Small LDL Particle Number: 275 nmol/L (ref <=527)
Triglycerides by NMR: 52 mg/dL (ref 0–149)

## 2013-08-27 LAB — VITAMIN D 25 HYDROXY (VIT D DEFICIENCY, FRACTURES): VIT D 25 HYDROXY: 38.3 ng/mL (ref 30.0–100.0)

## 2013-08-28 ENCOUNTER — Telehealth: Payer: Self-pay | Admitting: Family Medicine

## 2013-08-28 NOTE — Telephone Encounter (Signed)
Message copied by Waverly Ferrari on Thu Aug 28, 2013  2:04 PM ------      Message from: Chipper Herb      Created: Thu Aug 28, 2013 12:46 PM       Please let patient know that the rate of rise in PSA from the last check but we have is within normal limits. The last PSA was 2.60 on 12/19/2011. The rate of rise is no more than 0.5 and less than 2 years and this is okay. ------

## 2013-08-28 NOTE — Telephone Encounter (Signed)
Patient aware.

## 2013-11-13 DIAGNOSIS — I2510 Atherosclerotic heart disease of native coronary artery without angina pectoris: Secondary | ICD-10-CM | POA: Insufficient documentation

## 2013-11-27 ENCOUNTER — Other Ambulatory Visit: Payer: Self-pay | Admitting: Family Medicine

## 2013-12-09 ENCOUNTER — Other Ambulatory Visit: Payer: Self-pay | Admitting: Family Medicine

## 2014-02-26 ENCOUNTER — Encounter: Payer: Self-pay | Admitting: Family Medicine

## 2014-02-26 ENCOUNTER — Ambulatory Visit (INDEPENDENT_AMBULATORY_CARE_PROVIDER_SITE_OTHER): Payer: Medicare Other | Admitting: Family Medicine

## 2014-02-26 VITALS — BP 142/65 | HR 76 | Temp 96.9°F | Ht 67.0 in | Wt 160.0 lb

## 2014-02-26 DIAGNOSIS — E785 Hyperlipidemia, unspecified: Secondary | ICD-10-CM | POA: Diagnosis not present

## 2014-02-26 DIAGNOSIS — Z23 Encounter for immunization: Secondary | ICD-10-CM

## 2014-02-26 DIAGNOSIS — D696 Thrombocytopenia, unspecified: Secondary | ICD-10-CM | POA: Insufficient documentation

## 2014-02-26 DIAGNOSIS — I700 Atherosclerosis of aorta: Secondary | ICD-10-CM

## 2014-02-26 DIAGNOSIS — N400 Enlarged prostate without lower urinary tract symptoms: Secondary | ICD-10-CM | POA: Diagnosis not present

## 2014-02-26 DIAGNOSIS — I10 Essential (primary) hypertension: Secondary | ICD-10-CM | POA: Diagnosis not present

## 2014-02-26 DIAGNOSIS — E559 Vitamin D deficiency, unspecified: Secondary | ICD-10-CM

## 2014-02-26 DIAGNOSIS — L409 Psoriasis, unspecified: Secondary | ICD-10-CM

## 2014-02-26 LAB — POCT CBC
Granulocyte percent: 72.6 %G (ref 37–80)
HCT, POC: 46.8 % (ref 43.5–53.7)
Hemoglobin: 15.1 g/dL (ref 14.1–18.1)
Lymph, poc: 1.1 (ref 0.6–3.4)
MCH, POC: 30.6 pg (ref 27–31.2)
MCHC: 32.3 g/dL (ref 31.8–35.4)
MCV: 94.7 fL (ref 80–97)
MPV: 7.6 fL (ref 0–99.8)
POC Granulocyte: 3.9 (ref 2–6.9)
POC LYMPH PERCENT: 20.5 %L (ref 10–50)
Platelet Count, POC: 159 10*3/uL (ref 142–424)
RBC: 4.9 M/uL (ref 4.69–6.13)
RDW, POC: 13.4 %
WBC: 5.4 10*3/uL (ref 4.6–10.2)

## 2014-02-26 NOTE — Patient Instructions (Addendum)
Medicare Annual Wellness Visit  [facility] and the medical providers at [facility] strive to bring you the best medical care.  In doing so we not only want to address your current medical conditions and concerns but also to detect new conditions early and prevent illness, disease and health-related problems.    Medicare offers a yearly Wellness Visit which allows our clinical staff to assess your need for preventative services including immunizations, lifestyle education, counseling to decrease risk of preventable diseases and screening for fall risk and other medical concerns.    This visit is provided free of charge (no copay) for all Medicare recipients. The clinical pharmacists at [facility] have begun to conduct these Wellness Visits which will also include a thorough review of all your medications.    As you primary medical provider recommend that you make an appointment for your Annual Wellness Visit if you have not done so already this year.  You may set up this appointment before you leave today or you may call back ([phone]) and schedule an appointment.  Please make sure when you call that you mention that you are scheduling your Annual Wellness Visit with the clinical pharmacist so that the appointment may be made for the proper length of time.     Continue current medications. Continue good therapeutic lifestyle changes which include good diet and exercise. Fall precautions discussed with patient. If an FOBT was given today- please return it to our front desk. If you are over 19 years old - you may need Prevnar 33 or the adult Pneumonia vaccine.  Flu Shots will be available at our office starting mid- September. Please call and schedule a FLU CLINIC APPOINTMENT.   Continue to keep herself well hydrated Drink plenty of water Use cool mist humidifier and nasal saline Keep the house as cool as possible this  winter Since you have had a flare in your psoriasis call the dermatologist and reschedule a visit with him in [place]. Continue to follow-up regularly with the cardiology doctor. Watch sodium intake and bring blood pressures to each office visit for review

## 2014-02-26 NOTE — Addendum Note (Signed)
Addended by: Jamelle Haring on: 02/26/2014 09:52 AM   Modules accepted: Orders

## 2014-02-26 NOTE — Progress Notes (Signed)
Subjective:    Patient ID: Brian Blankenship, male    DOB: 29-Aug-1930, 81 y.o.   MRN: [id number]  HPI Pt here for follow up and management of chronic medical problems which include hyperlipidemia, Hypertension, and BPH. The patient has a history of an abnormal chest CT because of scattered pulmonary nodules. This goes back to 2013. The most recent chest CT was done in the spring of 2015 and it was felt that no more CT scans were warted based on the stability of the nodules. He does have atherosclerosis of the aorta. The patient is also seeing a cardiologist routinely about this. He sees Dr. Hamilton Capri in the morning [facility] and sees him yearly. He saw him in the fall. He checks his blood pressures at home and we will scan these into the record if he brings him in today. He does complain of a dry cough. He has had this for years. He says it's better when he is drinking more fluids. He is due to get lab work and to return an FOBT. It is important to note that he sees the dermatologist regularly at the [facility] in [place]. This is for psoriasis. The patient indicates that his blood pressures at home are consistently running in the low 120s and low 80s. He will do better with monitoring these readings and will bring these by for review when he comes to his visits here.       Patient Active Problem List   Diagnosis Date Noted  . Gout 02/24/2013  . Multiple pulmonary nodules 07/25/2012  . ED (erectile dysfunction)   . Hyperlipidemia   . BPH (benign prostatic hyperplasia)   . HTN (hypertension)   . Diverticulosis   . Psoriasis    Outpatient Encounter Prescriptions as of 02/26/2014  Medication Sig  . allopurinol (ZYLOPRIM) 100 MG tablet TAKE (2) TABLETS DAILY  . atorvastatin (LIPITOR) 40 MG tablet Take 40 mg by mouth daily.  . clobetasol ointment (TEMOVATE) 0.05 % APPLY TO PSORIASIS TWICE DAILY  . Coenzyme Q10 (COQ10 PO) Take 1 capsule by mouth daily.  .  fluocinonide (LIDEX) 0.05 % external solution APPLY TO SCALP AT BEDTIME  . MILK THISTLE PO Take 1 capsule by mouth daily.  . Multiple Vitamin (MULTIVITAMIN) tablet Take 1 tablet by mouth daily.  . Omega-3 Fatty Acids (OMEGA 3 PO) Take 1 capsule by mouth daily.  . Probiotic Product (PROBIOTIC DAILY PO) Take 1 capsule by mouth daily.  . valsartan-hydrochlorothiazide (DIOVAN-HCT) 320-25 MG per tablet TAKE 1 TABLET DAILY    Review of Systems  Constitutional: Negative.   HENT: Negative.   Eyes: Negative.   Respiratory: Positive for cough (DRY).   Cardiovascular: Negative.   Gastrointestinal: Negative.   Endocrine: Negative.   Genitourinary: Negative.   Musculoskeletal: Negative.   Skin: Negative.   Allergic/Immunologic: Negative.   Neurological: Negative.   Hematological: Negative.   Psychiatric/Behavioral: Negative.        Objective:   Physical Exam  Constitutional: He is oriented to person, place, and time. He appears well-developed and well-nourished. No distress.  HENT:  Head: Normocephalic and atraumatic.  Right Ear: External ear normal.  Left Ear: External ear normal.  Nose: Nose normal.  Mouth/Throat: Oropharynx is clear and moist. No oropharyngeal exudate.  Eyes: Conjunctivae and EOM are normal. Pupils are equal, round, and reactive to light. Right eye exhibits no discharge. Left eye exhibits no discharge. No scleral icterus.  Neck: Normal range of motion. Neck supple.  No thyromegaly present.  There are no anterior cervical nodes and there are no carotid bruits  Cardiovascular: Normal rate, regular rhythm, normal heart sounds and intact distal pulses.  Exam reveals no gallop and no friction rub.   No murmur heard. The heart has a regular rate and rhythm at 60/m  Pulmonary/Chest: Effort normal and breath sounds normal. No respiratory distress. He has no wheezes. He has no rales. He exhibits no tenderness.  There is no axillary adenopathy  Abdominal: Soft. Bowel sounds are  normal. He exhibits no mass. There is no tenderness. There is no rebound and no guarding.  There are no abdominal bruits or masses.  Musculoskeletal: Normal range of motion. He exhibits no edema or tenderness.  Lymphadenopathy:    He has no cervical adenopathy.  Neurological: He is alert and oriented to person, place, and time. He has normal reflexes. No cranial nerve deficit.  Skin: Skin is warm and dry. Rash noted. There is erythema. No pallor.  The patient has areas of psoriasis on his back abdomen and lower extremities.  Psychiatric: He has a normal mood and affect. His behavior is normal. Judgment and thought content normal.  Nursing note and vitals reviewed.  BP 142/65 mmHg  Pulse 76  Temp(Src) 96.9 F (36.1 C) (Oral)  Ht 5' 7"  (1.702 m)  Wt 160 lb (72.576 kg)  BMI 25.05 kg/m2        Assessment & Plan:  1. Hyperlipemia -Continue with current treatment and aggressive therapeutic lifestyle changes -We will call you with the results of the lab work as soon as those results become available - POCT CBC - NMR, lipoprofile  2. Vitamin D deficiency -For now continue current treatment - POCT CBC - Vit D  25 hydroxy (rtn osteoporosis monitoring)  3. Essential hypertension -Monitor blood pressures and bring readings in for review to next visit -Watch sodium intake - POCT CBC - BMP8+EGFR - Hepatic function panel  4. BPH (benign prostatic hypertrophy) - POCT CBC  5. Thoracic aortic atherosclerosis -Continue treatment with current cholesterol medication and blood pressure medication.  6. Psoriasis -Arrange appointment with dermatology at [facility]  Patient Instructions                       Medicare Annual Wellness Visit  [facility] and the medical providers at [facility] strive to bring you the best medical care.  In doing so we not only want to address your current medical conditions and concerns but also to detect new  conditions early and prevent illness, disease and health-related problems.    Medicare offers a yearly Wellness Visit which allows our clinical staff to assess your need for preventative services including immunizations, lifestyle education, counseling to decrease risk of preventable diseases and screening for fall risk and other medical concerns.    This visit is provided free of charge (no copay) for all Medicare recipients. The clinical pharmacists at [facility] have begun to conduct these Wellness Visits which will also include a thorough review of all your medications.    As you primary medical provider recommend that you make an appointment for your Annual Wellness Visit if you have not done so already this year.  You may set up this appointment before you leave today or you may call back ([phone]) and schedule an appointment.  Please make sure when you call that you mention that you are scheduling your Annual Wellness Visit  with the clinical pharmacist so that the appointment may be made for the proper length of time.     Continue current medications. Continue good therapeutic lifestyle changes which include good diet and exercise. Fall precautions discussed with patient. If an FOBT was given today- please return it to our front desk. If you are over 74 years old - you may need Prevnar 67 or the adult Pneumonia vaccine.  Flu Shots will be available at our office starting mid- September. Please call and schedule a FLU CLINIC APPOINTMENT.   Continue to keep herself well hydrated Drink plenty of water Use cool mist humidifier and nasal saline Keep the house as cool as possible this winter Since you have had a flare in your psoriasis call the dermatologist and reschedule a visit with him in [place]. Continue to follow-up regularly with the cardiology doctor. Watch sodium intake and bring blood pressures to each office visit for review   Arrie Senate  MD

## 2014-02-27 ENCOUNTER — Encounter: Payer: Self-pay | Admitting: Family Medicine

## 2014-02-27 ENCOUNTER — Other Ambulatory Visit: Payer: Medicare Other

## 2014-02-27 DIAGNOSIS — Z1212 Encounter for screening for malignant neoplasm of rectum: Secondary | ICD-10-CM | POA: Diagnosis not present

## 2014-02-27 LAB — NMR, LIPOPROFILE
Cholesterol: 137 mg/dL (ref 100–199)
HDL Cholesterol by NMR: 58 mg/dL (ref >39)
HDL Particle Number: 37.2 umol/L (ref >=30.5)
LDL Particle Number: 660 nmol/L (ref <1000)
LDL SIZE: 20.4 nm (ref >20.5)
LDL-C: 64 mg/dL (ref 0–99)
LP-IR Score: 33 (ref <=45)
Small LDL Particle Number: 315 nmol/L (ref <=527)
Triglycerides by NMR: 77 mg/dL (ref 0–149)

## 2014-02-27 LAB — BMP8+EGFR
BUN / CREAT RATIO: 11 (ref 10–22)
BUN: 14 mg/dL (ref 8–27)
CO2: 27 mmol/L (ref 18–29)
Calcium: 9.1 mg/dL (ref 8.6–10.2)
Chloride: 101 mmol/L (ref 97–108)
Creatinine, Ser: 1.24 mg/dL (ref 0.76–1.27)
GFR calc non Af Amer: 54 mL/min/{1.73_m2} — ABNORMAL LOW (ref >59)
GFR, EST AFRICAN AMERICAN: 63 mL/min/{1.73_m2} (ref >59)
Glucose: 92 mg/dL (ref 65–99)
Potassium: 3.9 mmol/L (ref 3.5–5.2)
Sodium: 142 mmol/L (ref 134–144)

## 2014-02-27 LAB — HEPATIC FUNCTION PANEL
ALK PHOS: 66 IU/L (ref 39–117)
ALT: 17 IU/L (ref 0–44)
AST: 21 IU/L (ref 0–40)
Albumin: 4.3 g/dL (ref 3.5–4.7)
Bilirubin, Direct: 0.27 mg/dL (ref 0.00–0.40)
Total Bilirubin: 1 mg/dL (ref 0.0–1.2)
Total Protein: 6.3 g/dL (ref 6.0–8.5)

## 2014-02-27 LAB — VITAMIN D 25 HYDROXY (VIT D DEFICIENCY, FRACTURES): Vit D, 25-Hydroxy: 37.3 ng/mL (ref 30.0–100.0)

## 2014-02-27 NOTE — Progress Notes (Signed)
LAB ONLY 

## 2014-03-01 LAB — FECAL OCCULT BLOOD, IMMUNOCHEMICAL: Fecal Occult Bld: NEGATIVE

## 2014-05-13 ENCOUNTER — Emergency Department (HOSPITAL_COMMUNITY): Payer: No Typology Code available for payment source

## 2014-05-13 ENCOUNTER — Inpatient Hospital Stay (HOSPITAL_COMMUNITY)
Admission: EM | Admit: 2014-05-13 | Discharge: 2014-05-19 | DRG: 964 | Disposition: A | Discharge status: Home / Self Care | Payer: No Typology Code available for payment source | Attending: General Surgery | Admitting: General Surgery

## 2014-05-13 ENCOUNTER — Ambulatory Visit (INDEPENDENT_AMBULATORY_CARE_PROVIDER_SITE_OTHER): Payer: Medicare Other | Admitting: Nurse Practitioner

## 2014-05-13 ENCOUNTER — Encounter: Payer: Self-pay | Admitting: Physician Assistant

## 2014-05-13 ENCOUNTER — Encounter (HOSPITAL_COMMUNITY): Payer: Self-pay | Admitting: Cardiology

## 2014-05-13 DIAGNOSIS — G960 Cerebrospinal fluid leak: Secondary | ICD-10-CM

## 2014-05-13 DIAGNOSIS — S27329A Contusion of lung, unspecified, initial encounter: Secondary | ICD-10-CM | POA: Diagnosis not present

## 2014-05-13 DIAGNOSIS — S3991XA Unspecified injury of abdomen, initial encounter: Secondary | ICD-10-CM | POA: Diagnosis not present

## 2014-05-13 DIAGNOSIS — J918 Pleural effusion in other conditions classified elsewhere: Secondary | ICD-10-CM | POA: Diagnosis not present

## 2014-05-13 DIAGNOSIS — Z9841 Cataract extraction status, right eye: Secondary | ICD-10-CM | POA: Diagnosis not present

## 2014-05-13 DIAGNOSIS — L409 Psoriasis, unspecified: Secondary | ICD-10-CM | POA: Diagnosis not present

## 2014-05-13 DIAGNOSIS — S270XXA Traumatic pneumothorax, initial encounter: Secondary | ICD-10-CM | POA: Diagnosis present

## 2014-05-13 DIAGNOSIS — N189 Chronic kidney disease, unspecified: Secondary | ICD-10-CM | POA: Diagnosis present

## 2014-05-13 DIAGNOSIS — E785 Hyperlipidemia, unspecified: Secondary | ICD-10-CM | POA: Diagnosis not present

## 2014-05-13 DIAGNOSIS — S2239XA Fracture of one rib, unspecified side, initial encounter for closed fracture: Secondary | ICD-10-CM

## 2014-05-13 DIAGNOSIS — Z91048 Other nonmedicinal substance allergy status: Secondary | ICD-10-CM | POA: Diagnosis not present

## 2014-05-13 DIAGNOSIS — S299XXA Unspecified injury of thorax, initial encounter: Secondary | ICD-10-CM | POA: Diagnosis not present

## 2014-05-13 DIAGNOSIS — S37009A Unspecified injury of unspecified kidney, initial encounter: Secondary | ICD-10-CM | POA: Diagnosis not present

## 2014-05-13 DIAGNOSIS — R404 Transient alteration of awareness: Secondary | ICD-10-CM | POA: Diagnosis not present

## 2014-05-13 DIAGNOSIS — M109 Gout, unspecified: Secondary | ICD-10-CM | POA: Diagnosis present

## 2014-05-13 DIAGNOSIS — S37011A Minor contusion of right kidney, initial encounter: Secondary | ICD-10-CM | POA: Diagnosis not present

## 2014-05-13 DIAGNOSIS — D181 Lymphangioma, any site: Secondary | ICD-10-CM | POA: Diagnosis present

## 2014-05-13 DIAGNOSIS — S37039A Laceration of unspecified kidney, unspecified degree, initial encounter: Secondary | ICD-10-CM | POA: Diagnosis present

## 2014-05-13 DIAGNOSIS — R42 Dizziness and giddiness: Secondary | ICD-10-CM | POA: Diagnosis not present

## 2014-05-13 DIAGNOSIS — Z9842 Cataract extraction status, left eye: Secondary | ICD-10-CM | POA: Diagnosis not present

## 2014-05-13 DIAGNOSIS — N400 Enlarged prostate without lower urinary tract symptoms: Secondary | ICD-10-CM | POA: Diagnosis present

## 2014-05-13 DIAGNOSIS — S2249XA Multiple fractures of ribs, unspecified side, initial encounter for closed fracture: Secondary | ICD-10-CM | POA: Diagnosis not present

## 2014-05-13 DIAGNOSIS — N179 Acute kidney failure, unspecified: Secondary | ICD-10-CM | POA: Diagnosis not present

## 2014-05-13 DIAGNOSIS — S2243XA Multiple fractures of ribs, bilateral, initial encounter for closed fracture: Secondary | ICD-10-CM | POA: Diagnosis not present

## 2014-05-13 DIAGNOSIS — S065X0A Traumatic subdural hemorrhage without loss of consciousness, initial encounter: Secondary | ICD-10-CM | POA: Diagnosis not present

## 2014-05-13 DIAGNOSIS — R55 Syncope and collapse: Secondary | ICD-10-CM | POA: Diagnosis not present

## 2014-05-13 DIAGNOSIS — I129 Hypertensive chronic kidney disease with stage 1 through stage 4 chronic kidney disease, or unspecified chronic kidney disease: Secondary | ICD-10-CM | POA: Diagnosis present

## 2014-05-13 DIAGNOSIS — Y92410 Unspecified street and highway as the place of occurrence of the external cause: Secondary | ICD-10-CM

## 2014-05-13 DIAGNOSIS — D62 Acute posthemorrhagic anemia: Secondary | ICD-10-CM | POA: Diagnosis not present

## 2014-05-13 DIAGNOSIS — S065XAA Traumatic subdural hemorrhage with loss of consciousness status unknown, initial encounter: Secondary | ICD-10-CM

## 2014-05-13 DIAGNOSIS — Z961 Presence of intraocular lens: Secondary | ICD-10-CM | POA: Diagnosis present

## 2014-05-13 DIAGNOSIS — J9811 Atelectasis: Secondary | ICD-10-CM | POA: Diagnosis not present

## 2014-05-13 DIAGNOSIS — S065X9A Traumatic subdural hemorrhage with loss of consciousness of unspecified duration, initial encounter: Secondary | ICD-10-CM | POA: Diagnosis present

## 2014-05-13 DIAGNOSIS — S2242XA Multiple fractures of ribs, left side, initial encounter for closed fracture: Secondary | ICD-10-CM | POA: Diagnosis not present

## 2014-05-13 DIAGNOSIS — S37031A Laceration of right kidney, unspecified degree, initial encounter: Principal | ICD-10-CM | POA: Diagnosis present

## 2014-05-13 DIAGNOSIS — Z87891 Personal history of nicotine dependence: Secondary | ICD-10-CM

## 2014-05-13 DIAGNOSIS — J9381 Chronic pneumothorax: Secondary | ICD-10-CM | POA: Diagnosis not present

## 2014-05-13 DIAGNOSIS — S2232XA Fracture of one rib, left side, initial encounter for closed fracture: Secondary | ICD-10-CM

## 2014-05-13 DIAGNOSIS — D696 Thrombocytopenia, unspecified: Secondary | ICD-10-CM | POA: Diagnosis not present

## 2014-05-13 DIAGNOSIS — S199XXA Unspecified injury of neck, initial encounter: Secondary | ICD-10-CM | POA: Diagnosis not present

## 2014-05-13 DIAGNOSIS — R0602 Shortness of breath: Secondary | ICD-10-CM

## 2014-05-13 DIAGNOSIS — S0990XA Unspecified injury of head, initial encounter: Secondary | ICD-10-CM | POA: Diagnosis not present

## 2014-05-13 DIAGNOSIS — I2510 Atherosclerotic heart disease of native coronary artery without angina pectoris: Secondary | ICD-10-CM | POA: Diagnosis present

## 2014-05-13 DIAGNOSIS — G9608 Other cranial cerebrospinal fluid leak: Secondary | ICD-10-CM | POA: Diagnosis present

## 2014-05-13 DIAGNOSIS — J939 Pneumothorax, unspecified: Secondary | ICD-10-CM

## 2014-05-13 HISTORY — DX: Hyperlipidemia, unspecified: E78.5

## 2014-05-13 HISTORY — DX: Fracture of one rib, unspecified side, initial encounter for closed fracture: S22.39XA

## 2014-05-13 LAB — URINALYSIS, ROUTINE W REFLEX MICROSCOPIC
Bilirubin Urine: NEGATIVE
Glucose, UA: NEGATIVE mg/dL
Leukocytes, UA: NEGATIVE
Nitrite: NEGATIVE
PH: 5.5 (ref 5.0–8.0)
Protein, ur: 30 mg/dL — AB
SPECIFIC GRAVITY, URINE: 1.01 (ref 1.005–1.030)
Urobilinogen, UA: 0.2 mg/dL (ref 0.0–1.0)

## 2014-05-13 LAB — COMPREHENSIVE METABOLIC PANEL
ALT: 25 U/L (ref 0–53)
ANION GAP: 9 (ref 5–15)
AST: 37 U/L (ref 0–37)
Albumin: 3.9 g/dL (ref 3.5–5.2)
Alkaline Phosphatase: 62 U/L (ref 39–117)
BUN: 20 mg/dL (ref 6–23)
CO2: 27 mmol/L (ref 19–32)
Calcium: 9 mg/dL (ref 8.4–10.5)
Chloride: 103 mmol/L (ref 96–112)
Creatinine, Ser: 1.55 mg/dL — ABNORMAL HIGH (ref 0.50–1.35)
GFR calc Af Amer: 47 mL/min — ABNORMAL LOW (ref >90)
GFR, EST NON AFRICAN AMERICAN: 40 mL/min — AB (ref >90)
Glucose, Bld: 106 mg/dL — ABNORMAL HIGH (ref 70–99)
POTASSIUM: 3.6 mmol/L (ref 3.5–5.1)
Sodium: 139 mmol/L (ref 135–145)
Total Bilirubin: 1.5 mg/dL — ABNORMAL HIGH (ref 0.3–1.2)
Total Protein: 6.7 g/dL (ref 6.0–8.3)

## 2014-05-13 LAB — CBC WITH DIFFERENTIAL/PLATELET
Basophils Absolute: 0 10*3/uL (ref 0.0–0.1)
Basophils Relative: 0 % (ref 0–1)
Eosinophils Absolute: 0 10*3/uL (ref 0.0–0.7)
Eosinophils Relative: 0 % (ref 0–5)
HEMATOCRIT: 44.2 % (ref 39.0–52.0)
Hemoglobin: 15.1 g/dL (ref 13.0–17.0)
LYMPHS ABS: 1 10*3/uL (ref 0.7–4.0)
LYMPHS PCT: 6 % — AB (ref 12–46)
MCH: 32.6 pg (ref 26.0–34.0)
MCHC: 34.2 g/dL (ref 30.0–36.0)
MCV: 95.5 fL (ref 78.0–100.0)
MONOS PCT: 5 % (ref 3–12)
Monocytes Absolute: 0.8 10*3/uL (ref 0.1–1.0)
NEUTROS ABS: 13.8 10*3/uL — AB (ref 1.7–7.7)
Neutrophils Relative %: 89 % — ABNORMAL HIGH (ref 43–77)
Platelets: 149 10*3/uL — ABNORMAL LOW (ref 150–400)
RBC: 4.63 MIL/uL (ref 4.22–5.81)
RDW: 12.9 % (ref 11.5–15.5)
WBC: 15.5 10*3/uL — ABNORMAL HIGH (ref 4.0–10.5)

## 2014-05-13 LAB — URINE MICROSCOPIC-ADD ON

## 2014-05-13 LAB — MRSA PCR SCREENING: MRSA BY PCR: NEGATIVE

## 2014-05-13 MED ORDER — MORPHINE SULFATE 2 MG/ML IJ SOLN
INTRAMUSCULAR | Status: AC
  Administered 2014-05-13: 2 mg via INTRAMUSCULAR
  Filled 2014-05-13: qty 1

## 2014-05-13 MED ORDER — POTASSIUM CHLORIDE IN NACL 20-0.9 MEQ/L-% IV SOLN
INTRAVENOUS | Status: DC
  Administered 2014-05-13 – 2014-05-15 (×3): via INTRAVENOUS
  Administered 2014-05-15: 1000 mL via INTRAVENOUS
  Administered 2014-05-16 – 2014-05-18 (×4): via INTRAVENOUS
  Filled 2014-05-13 (×15): qty 1000

## 2014-05-13 MED ORDER — MORPHINE SULFATE 2 MG/ML IJ SOLN
2.0000 mg | Freq: Once | INTRAMUSCULAR | Status: AC
  Administered 2014-05-13: 2 mg via INTRAMUSCULAR

## 2014-05-13 MED ORDER — IOHEXOL 300 MG/ML  SOLN
80.0000 mL | Freq: Once | INTRAMUSCULAR | Status: AC | PRN
  Administered 2014-05-13: 80 mL via INTRAVENOUS

## 2014-05-13 MED ORDER — HYDROCODONE-ACETAMINOPHEN 5-325 MG PO TABS
1.0000 | ORAL_TABLET | ORAL | Status: DC | PRN
  Administered 2014-05-13 – 2014-05-15 (×4): 1 via ORAL
  Administered 2014-05-15: 2 via ORAL
  Administered 2014-05-16: 1 via ORAL
  Administered 2014-05-16: 2 via ORAL
  Filled 2014-05-13 (×5): qty 1
  Filled 2014-05-13 (×2): qty 2

## 2014-05-13 MED ORDER — MORPHINE SULFATE 2 MG/ML IJ SOLN
1.0000 mg | INTRAMUSCULAR | Status: DC | PRN
  Administered 2014-05-14 (×2): 2 mg via INTRAVENOUS
  Filled 2014-05-13 (×2): qty 1

## 2014-05-13 MED ORDER — IRBESARTAN 150 MG PO TABS
150.0000 mg | ORAL_TABLET | Freq: Every day | ORAL | Status: DC
  Administered 2014-05-14: 150 mg via ORAL
  Filled 2014-05-13: qty 1

## 2014-05-13 MED ORDER — SODIUM CHLORIDE 0.9 % IV BOLUS (SEPSIS)
500.0000 mL | Freq: Once | INTRAVENOUS | Status: AC
  Administered 2014-05-13: 500 mL via INTRAVENOUS

## 2014-05-13 MED ORDER — DOCUSATE SODIUM 100 MG PO CAPS
100.0000 mg | ORAL_CAPSULE | Freq: Two times a day (BID) | ORAL | Status: DC
  Administered 2014-05-13 – 2014-05-18 (×10): 100 mg via ORAL
  Filled 2014-05-13 (×10): qty 1

## 2014-05-13 MED ORDER — ONDANSETRON HCL 4 MG/2ML IJ SOLN: 4.0000 mg | Freq: Four times a day (QID) | INTRAMUSCULAR | Status: DC | PRN

## 2014-05-13 MED ORDER — VALSARTAN-HYDROCHLOROTHIAZIDE 160-12.5 MG PO TABS: 1.0000 | ORAL_TABLET | Freq: Every day | ORAL | Status: DC

## 2014-05-13 MED ORDER — ACETAMINOPHEN 325 MG PO TABS: 650.0000 mg | ORAL_TABLET | ORAL | Status: DC | PRN

## 2014-05-13 MED ORDER — ATORVASTATIN CALCIUM 40 MG PO TABS
40.0000 mg | ORAL_TABLET | Freq: Every day | ORAL | Status: DC
  Administered 2014-05-14: 20 mg via ORAL
  Filled 2014-05-13: qty 1

## 2014-05-13 MED ORDER — ALLOPURINOL 100 MG PO TABS
100.0000 mg | ORAL_TABLET | Freq: Every day | ORAL | Status: DC
  Administered 2014-05-14 – 2014-05-19 (×6): 100 mg via ORAL
  Filled 2014-05-13 (×6): qty 1

## 2014-05-13 MED ORDER — HYDROCHLOROTHIAZIDE 12.5 MG PO CAPS
12.5000 mg | ORAL_CAPSULE | Freq: Every day | ORAL | Status: DC
  Administered 2014-05-14: 12.5 mg via ORAL
  Filled 2014-05-13: qty 1

## 2014-05-13 MED ORDER — ONDANSETRON HCL 4 MG PO TABS: 4.0000 mg | ORAL_TABLET | Freq: Four times a day (QID) | ORAL | Status: DC | PRN

## 2014-05-13 MED ORDER — BISACODYL 10 MG RE SUPP
10.0000 mg | Freq: Every day | RECTAL | Status: DC | PRN
  Filled 2014-05-13: qty 1

## 2014-05-13 NOTE — ED Notes (Signed)
Pt no distress.  Alert and oriented.  Only c/o is some left sided rib, flank pain.

## 2014-05-13 NOTE — ED Notes (Signed)
MD at bedside. 

## 2014-05-13 NOTE — Progress Notes (Signed)
Patient ID: Brian Blankenship, male   DOB: [birth year], 81 y.o.   MRN: [id number] Reviewed CCT...minimal chronic subdural hygromatous collection with some dural thickening.  No intervention needed.
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
## 2014-05-14 DIAGNOSIS — S270XXA Traumatic pneumothorax, initial encounter: Secondary | ICD-10-CM | POA: Diagnosis present

## 2014-05-14 DIAGNOSIS — J9383 Other pneumothorax: Secondary | ICD-10-CM | POA: Diagnosis not present

## 2014-05-14 DIAGNOSIS — S37031A Laceration of right kidney, unspecified degree, initial encounter: Secondary | ICD-10-CM | POA: Diagnosis present

## 2014-05-14 DIAGNOSIS — S37011A Minor contusion of right kidney, initial encounter: Secondary | ICD-10-CM | POA: Diagnosis present

## 2014-05-14 DIAGNOSIS — N179 Acute kidney failure, unspecified: Secondary | ICD-10-CM | POA: Diagnosis present

## 2014-05-14 DIAGNOSIS — Z91048 Other nonmedicinal substance allergy status: Secondary | ICD-10-CM | POA: Diagnosis not present

## 2014-05-14 DIAGNOSIS — S299XXA Unspecified injury of thorax, initial encounter: Secondary | ICD-10-CM | POA: Diagnosis not present

## 2014-05-14 DIAGNOSIS — D62 Acute posthemorrhagic anemia: Secondary | ICD-10-CM | POA: Diagnosis not present

## 2014-05-14 DIAGNOSIS — S065X9A Traumatic subdural hemorrhage with loss of consciousness of unspecified duration, initial encounter: Secondary | ICD-10-CM | POA: Diagnosis present

## 2014-05-14 DIAGNOSIS — S2249XA Multiple fractures of ribs, unspecified side, initial encounter for closed fracture: Secondary | ICD-10-CM | POA: Diagnosis not present

## 2014-05-14 DIAGNOSIS — Z87891 Personal history of nicotine dependence: Secondary | ICD-10-CM | POA: Diagnosis not present

## 2014-05-14 DIAGNOSIS — S2242XA Multiple fractures of ribs, left side, initial encounter for closed fracture: Secondary | ICD-10-CM | POA: Diagnosis not present

## 2014-05-14 DIAGNOSIS — S27329A Contusion of lung, unspecified, initial encounter: Secondary | ICD-10-CM | POA: Diagnosis present

## 2014-05-14 DIAGNOSIS — S37009A Unspecified injury of unspecified kidney, initial encounter: Secondary | ICD-10-CM | POA: Diagnosis not present

## 2014-05-14 DIAGNOSIS — S2243XA Multiple fractures of ribs, bilateral, initial encounter for closed fracture: Secondary | ICD-10-CM | POA: Diagnosis present

## 2014-05-14 DIAGNOSIS — Y92410 Unspecified street and highway as the place of occurrence of the external cause: Secondary | ICD-10-CM | POA: Diagnosis not present

## 2014-05-14 DIAGNOSIS — M109 Gout, unspecified: Secondary | ICD-10-CM | POA: Diagnosis present

## 2014-05-14 DIAGNOSIS — D696 Thrombocytopenia, unspecified: Secondary | ICD-10-CM | POA: Diagnosis present

## 2014-05-14 DIAGNOSIS — R079 Chest pain, unspecified: Secondary | ICD-10-CM | POA: Diagnosis not present

## 2014-05-14 DIAGNOSIS — Z9841 Cataract extraction status, right eye: Secondary | ICD-10-CM | POA: Diagnosis not present

## 2014-05-14 DIAGNOSIS — I129 Hypertensive chronic kidney disease with stage 1 through stage 4 chronic kidney disease, or unspecified chronic kidney disease: Secondary | ICD-10-CM | POA: Diagnosis present

## 2014-05-14 DIAGNOSIS — L409 Psoriasis, unspecified: Secondary | ICD-10-CM | POA: Diagnosis present

## 2014-05-14 DIAGNOSIS — R0602 Shortness of breath: Secondary | ICD-10-CM | POA: Diagnosis not present

## 2014-05-14 DIAGNOSIS — I2510 Atherosclerotic heart disease of native coronary artery without angina pectoris: Secondary | ICD-10-CM | POA: Diagnosis present

## 2014-05-14 DIAGNOSIS — J939 Pneumothorax, unspecified: Secondary | ICD-10-CM | POA: Diagnosis not present

## 2014-05-14 DIAGNOSIS — N189 Chronic kidney disease, unspecified: Secondary | ICD-10-CM | POA: Diagnosis present

## 2014-05-14 DIAGNOSIS — S37001A Unspecified injury of right kidney, initial encounter: Secondary | ICD-10-CM | POA: Diagnosis not present

## 2014-05-14 DIAGNOSIS — D181 Lymphangioma, any site: Secondary | ICD-10-CM | POA: Diagnosis present

## 2014-05-14 DIAGNOSIS — Z9842 Cataract extraction status, left eye: Secondary | ICD-10-CM | POA: Diagnosis not present

## 2014-05-14 DIAGNOSIS — E785 Hyperlipidemia, unspecified: Secondary | ICD-10-CM | POA: Diagnosis present

## 2014-05-14 DIAGNOSIS — Z961 Presence of intraocular lens: Secondary | ICD-10-CM | POA: Diagnosis present

## 2014-05-14 DIAGNOSIS — N400 Enlarged prostate without lower urinary tract symptoms: Secondary | ICD-10-CM | POA: Diagnosis present

## 2014-05-14 LAB — COMPREHENSIVE METABOLIC PANEL
ALT: 23 U/L (ref 0–53)
AST: 34 U/L (ref 0–37)
Albumin: 3 g/dL — ABNORMAL LOW (ref 3.5–5.2)
Alkaline Phosphatase: 51 U/L (ref 39–117)
Anion gap: 8 (ref 5–15)
BILIRUBIN TOTAL: 1.5 mg/dL — AB (ref 0.3–1.2)
BUN: 24 mg/dL — ABNORMAL HIGH (ref 6–23)
CO2: 22 mmol/L (ref 19–32)
CREATININE: 1.94 mg/dL — AB (ref 0.50–1.35)
Calcium: 8 mg/dL — ABNORMAL LOW (ref 8.4–10.5)
Chloride: 110 mmol/L (ref 96–112)
GFR calc Af Amer: 36 mL/min — ABNORMAL LOW (ref >90)
GFR, EST NON AFRICAN AMERICAN: 31 mL/min — AB (ref >90)
Glucose, Bld: 120 mg/dL — ABNORMAL HIGH (ref 70–99)
POTASSIUM: 4 mmol/L (ref 3.5–5.1)
Sodium: 140 mmol/L (ref 135–145)
Total Protein: 5.2 g/dL — ABNORMAL LOW (ref 6.0–8.3)

## 2014-05-14 LAB — CBC
HCT: 38 % — ABNORMAL LOW (ref 39.0–52.0)
HCT: 38.5 % — ABNORMAL LOW (ref 39.0–52.0)
HEMOGLOBIN: 13.3 g/dL (ref 13.0–17.0)
Hemoglobin: 12.7 g/dL — ABNORMAL LOW (ref 13.0–17.0)
MCH: 31.8 pg (ref 26.0–34.0)
MCH: 32.5 pg (ref 26.0–34.0)
MCHC: 33.4 g/dL (ref 30.0–36.0)
MCHC: 34.5 g/dL (ref 30.0–36.0)
MCV: 95.2 fL (ref 78.0–100.0)
MCV: 94.1 fL (ref 78.0–100.0)
PLATELETS: 138 10*3/uL — AB (ref 150–400)
Platelets: 124 10*3/uL — ABNORMAL LOW (ref 150–400)
RBC: 3.99 MIL/uL — ABNORMAL LOW (ref 4.22–5.81)
RBC: 4.09 MIL/uL — AB (ref 4.22–5.81)
RDW: 13 % (ref 11.5–15.5)
RDW: 13.1 % (ref 11.5–15.5)
WBC: 8.8 10*3/uL (ref 4.0–10.5)
WBC: 7.1 10*3/uL (ref 4.0–10.5)

## 2014-05-14 MED ORDER — ATORVASTATIN CALCIUM 20 MG PO TABS
20.0000 mg | ORAL_TABLET | Freq: Every day | ORAL | Status: DC
  Administered 2014-05-15 – 2014-05-18 (×4): 20 mg via ORAL
  Filled 2014-05-14 (×4): qty 1
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
## 2014-05-14 NOTE — Progress Notes (Signed)
Pt voided 100cc, PVR was 0, will continue to monitor
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
## 2014-05-14 NOTE — Progress Notes (Signed)
UR completed 
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
## 2014-05-15 LAB — BASIC METABOLIC PANEL
ANION GAP: 3 — AB (ref 5–15)
BUN: 24 mg/dL — ABNORMAL HIGH (ref 6–23)
CALCIUM: 8.1 mg/dL — AB (ref 8.4–10.5)
CO2: 26 mmol/L (ref 19–32)
Chloride: 110 mmol/L (ref 96–112)
Creatinine, Ser: 2.03 mg/dL — ABNORMAL HIGH (ref 0.50–1.35)
GFR calc Af Amer: 34 mL/min — ABNORMAL LOW (ref >90)
GFR, EST NON AFRICAN AMERICAN: 29 mL/min — AB (ref >90)
Glucose, Bld: 129 mg/dL — ABNORMAL HIGH (ref 70–99)
Potassium: 4.3 mmol/L (ref 3.5–5.1)
SODIUM: 139 mmol/L (ref 135–145)

## 2014-05-15 LAB — CBC
HCT: 36.5 % — ABNORMAL LOW (ref 39.0–52.0)
HEMOGLOBIN: 12.2 g/dL — AB (ref 13.0–17.0)
MCH: 31.6 pg (ref 26.0–34.0)
MCHC: 33.4 g/dL (ref 30.0–36.0)
MCV: 94.6 fL (ref 78.0–100.0)
Platelets: 120 10*3/uL — ABNORMAL LOW (ref 150–400)
RBC: 3.86 MIL/uL — ABNORMAL LOW (ref 4.22–5.81)
RDW: 13.4 % (ref 11.5–15.5)
WBC: 5.9 10*3/uL (ref 4.0–10.5)

## 2014-05-15 MED ORDER — MORPHINE SULFATE 2 MG/ML IJ SOLN
1.0000 mg | INTRAMUSCULAR | Status: DC | PRN
  Administered 2014-05-15: 2 mg via INTRAVENOUS
  Filled 2014-05-15: qty 1
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
## 2014-05-15 NOTE — Progress Notes (Signed)
  Subjective: Patient reports significant ongoing discomfort with movement or deep breathing. He is voiding without difficulty and is had no gross hematuria. Hemoglobin remained stable but creatinine continues to slowly increase  Objective: Vital signs in last 24 hours: Temp:  [98.6 F (37 C)-99.3 F (37.4 C)] 99.3 F (37.4 C) (03/25 1125) Pulse Rate:  [78-88] 80 (03/25 1125) Resp:  [18-27] 23 (03/25 1125) BP: (122-135)/(54-73) 122/55 mmHg (03/25 1125) SpO2:  [96 %-98 %] 98 % (03/25 1125)  Intake/Output from previous day: 03/24 0701 - 03/25 0700 In: 4081 [P.O.:200; I.V.:1220] Out: 720 [Urine:720] Intake/Output this shift: Total I/O In: -  Out: 450 [Urine:450]  Physical Exam:  Constitutional: Vital signs reviewed. WD WN in NAD   Eyes: PERRL, No scleral icterus.   Cardiovascular: RRR Pulmonary/Chest: Normal effort Abdominal: Soft. Extremities: No cyanosis or edema   Lab Results:  Recent Labs  05/14/14 0600 05/14/14 1410 05/15/14 0525  HGB 12.7* 13.3 12.2*  HCT 38.0* 38.5* 36.5*   BMET  Recent Labs  05/14/14 0600 05/15/14 0525  NA 140 139  K 4.0 4.3  CL 110 110  CO2 22 26  GLUCOSE 120* 129*  BUN 24* 24*  CREATININE 1.94* 2.03*  CALCIUM 8.0* 8.1*   No results for input(s): LABPT, INR in the last 72 hours. No results for input(s): LABURIN in the last 72 hours. Results for orders placed or performed during the hospital encounter of 05/13/14  MRSA PCR Screening     Status: None   Collection Time: 05/13/14  3:53 PM  Result Value Ref Range Status   MRSA by PCR NEGATIVE NEGATIVE Final    Comment:        The GeneXpert MRSA Assay (FDA approved for NASAL specimens only), is one component of a comprehensive MRSA colonization surveillance program. It is not intended to diagnose MRSA infection nor to guide or monitor treatment for MRSA infections.     Studies/Results: No results found.  Assessment/Plan:   Perinephric hematoma with compression of the  parenchyma. No evidence of ongoing bleeding was stable hemoglobin and vital signs. Doppler renal ultrasound may be necessary to assess ongoing flow to that kidney. If there is decreased or no perfusion treatment options would be quite limited however.    LOS: 2 days   Elexia Friedt S 05/15/2014, 1:56 PM
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
## 2014-05-16 ENCOUNTER — Inpatient Hospital Stay (HOSPITAL_COMMUNITY): Payer: No Typology Code available for payment source

## 2014-05-16 LAB — BASIC METABOLIC PANEL
ANION GAP: 5 (ref 5–15)
BUN: 23 mg/dL (ref 6–23)
CHLORIDE: 110 mmol/L (ref 96–112)
CO2: 23 mmol/L (ref 19–32)
Calcium: 8 mg/dL — ABNORMAL LOW (ref 8.4–10.5)
Creatinine, Ser: 1.92 mg/dL — ABNORMAL HIGH (ref 0.50–1.35)
GFR calc Af Amer: 36 mL/min — ABNORMAL LOW (ref >90)
GFR calc non Af Amer: 31 mL/min — ABNORMAL LOW (ref >90)
Glucose, Bld: 113 mg/dL — ABNORMAL HIGH (ref 70–99)
Potassium: 4 mmol/L (ref 3.5–5.1)
SODIUM: 138 mmol/L (ref 135–145)

## 2014-05-16 LAB — CBC
HEMATOCRIT: 36.6 % — AB (ref 39.0–52.0)
Hemoglobin: 12.2 g/dL — ABNORMAL LOW (ref 13.0–17.0)
MCH: 31.9 pg (ref 26.0–34.0)
MCHC: 33.3 g/dL (ref 30.0–36.0)
MCV: 95.6 fL (ref 78.0–100.0)
PLATELETS: 125 10*3/uL — AB (ref 150–400)
RBC: 3.83 MIL/uL — ABNORMAL LOW (ref 4.22–5.81)
RDW: 13 % (ref 11.5–15.5)
WBC: 7.4 10*3/uL (ref 4.0–10.5)
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
## 2014-05-16 NOTE — Progress Notes (Signed)
CXR shows small apical PTX and hemothorax.   Stable Will follow

## 2014-05-16 NOTE — Progress Notes (Signed)
Dr. Donella Stade updated with [facility] result called in by radiologist. No new orders received.

## 2014-05-17 ENCOUNTER — Inpatient Hospital Stay (HOSPITAL_COMMUNITY): Payer: No Typology Code available for payment source

## 2014-05-17 LAB — BASIC METABOLIC PANEL
Anion gap: 5 (ref 5–15)
BUN: 19 mg/dL (ref 6–23)
CO2: 21 mmol/L (ref 19–32)
Calcium: 8.2 mg/dL — ABNORMAL LOW (ref 8.4–10.5)
Chloride: 107 mmol/L (ref 96–112)
Creatinine, Ser: 1.84 mg/dL — ABNORMAL HIGH (ref 0.50–1.35)
GFR calc Af Amer: 38 mL/min — ABNORMAL LOW (ref >90)
GFR calc non Af Amer: 33 mL/min — ABNORMAL LOW (ref >90)
Glucose, Bld: 98 mg/dL (ref 70–99)
Potassium: 4.2 mmol/L (ref 3.5–5.1)
Sodium: 133 mmol/L — ABNORMAL LOW (ref 135–145)

## 2014-05-17 LAB — CBC
HEMATOCRIT: 33.7 % — AB (ref 39.0–52.0)
HEMOGLOBIN: 11.4 g/dL — AB (ref 13.0–17.0)
MCH: 31.7 pg (ref 26.0–34.0)
MCHC: 33.8 g/dL (ref 30.0–36.0)
MCV: 93.6 fL (ref 78.0–100.0)
Platelets: 135 10*3/uL — ABNORMAL LOW (ref 150–400)
RBC: 3.6 MIL/uL — ABNORMAL LOW (ref 4.22–5.81)
RDW: 12.9 % (ref 11.5–15.5)
WBC: 7.4 10*3/uL (ref 4.0–10.5)
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
## 2014-05-17 NOTE — Progress Notes (Signed)
Updated dr. Hulen Skains on latest  2View CXR indicating slight increase in PTX. No new orders received.

## 2014-05-18 ENCOUNTER — Inpatient Hospital Stay (HOSPITAL_COMMUNITY): Payer: No Typology Code available for payment source

## 2014-05-18 DIAGNOSIS — G9608 Other cranial cerebrospinal fluid leak: Secondary | ICD-10-CM | POA: Diagnosis present

## 2014-05-18 DIAGNOSIS — G960 Cerebrospinal fluid leak: Secondary | ICD-10-CM

## 2014-05-18 DIAGNOSIS — S37039A Laceration of unspecified kidney, unspecified degree, initial encounter: Secondary | ICD-10-CM | POA: Diagnosis present

## 2014-05-18 DIAGNOSIS — D62 Acute posthemorrhagic anemia: Secondary | ICD-10-CM | POA: Diagnosis not present

## 2014-05-18 DIAGNOSIS — N179 Acute kidney failure, unspecified: Secondary | ICD-10-CM | POA: Diagnosis present

## 2014-05-18 DIAGNOSIS — N189 Chronic kidney disease, unspecified: Secondary | ICD-10-CM

## 2014-05-18 LAB — CBC
HCT: 33.2 % — ABNORMAL LOW (ref 39.0–52.0)
HEMOGLOBIN: 11.3 g/dL — AB (ref 13.0–17.0)
MCH: 31.7 pg (ref 26.0–34.0)
MCHC: 34 g/dL (ref 30.0–36.0)
MCV: 93.3 fL (ref 78.0–100.0)
Platelets: 159 10*3/uL (ref 150–400)
RBC: 3.56 MIL/uL — AB (ref 4.22–5.81)
RDW: 12.7 % (ref 11.5–15.5)
WBC: 6.9 10*3/uL (ref 4.0–10.5)

## 2014-05-18 LAB — BASIC METABOLIC PANEL
Anion gap: 5 (ref 5–15)
BUN: 19 mg/dL (ref 6–23)
CHLORIDE: 108 mmol/L (ref 96–112)
CO2: 22 mmol/L (ref 19–32)
Calcium: 8.2 mg/dL — ABNORMAL LOW (ref 8.4–10.5)
Creatinine, Ser: 1.89 mg/dL — ABNORMAL HIGH (ref 0.50–1.35)
GFR calc Af Amer: 37 mL/min — ABNORMAL LOW (ref >90)
GFR calc non Af Amer: 32 mL/min — ABNORMAL LOW (ref >90)
GLUCOSE: 100 mg/dL — AB (ref 70–99)
POTASSIUM: 4.1 mmol/L (ref 3.5–5.1)
Sodium: 135 mmol/L (ref 135–145)

## 2014-05-18 MED ORDER — TRAMADOL HCL 50 MG PO TABS
50.0000 mg | ORAL_TABLET | Freq: Four times a day (QID) | ORAL | Status: DC | PRN
  Administered 2014-05-18 – 2014-05-19 (×3): 50 mg via ORAL
  Filled 2014-05-18 (×3): qty 1

## 2014-05-18 MED ORDER — DOCUSATE SODIUM 100 MG PO CAPS
100.0000 mg | ORAL_CAPSULE | Freq: Two times a day (BID) | ORAL | Status: DC
  Filled 2014-05-18: qty 1

## 2014-05-18 MED ORDER — MORPHINE SULFATE 2 MG/ML IJ SOLN: 2.0000 mg | INTRAMUSCULAR | Status: DC | PRN

## 2014-05-18 MED ORDER — POLYETHYLENE GLYCOL 3350 17 G PO PACK: 17.0000 g | PACK | Freq: Every day | ORAL | Status: DC
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
## 2014-05-18 NOTE — Progress Notes (Signed)
SATURATION QUALIFICATIONS: (This note is used to comply with regulatory documentation for home oxygen)  Patient Saturations on Room Air at Rest = 98%  Patient Saturations on Room Air while Ambulating = 91%  Patient Saturations on 0 Liters of oxygen while Ambulating = 91-95%  Please briefly explain why patient needs home oxygen:  Pt does not qualify for home oxygen.  Pt ambulated around unit once and oxygen saturation remained predominately between 91-93% on RA.  Pt tolerated well with no s/s of distress or shortness of breath.  At rest pt oxygen saturation was 98% on room air.  Will continue to monitor. Graceann Congress

## 2014-05-18 NOTE — Progress Notes (Signed)
PT Cancellation Note  Patient Details Name: Brian Blankenship MRN: [id number] DOB: [birth year]   Cancelled Treatment:    Reason Eval/Treat Not Completed: Patient declined, no reason specified;Other (comment) (Two attempts to see pt and both times pt asked for a delay)   Ramond Dial 05/18/2014, 10:30 AM  Mee Hives, PT MS Acute Rehab Dept. Number: [phone]

## 2014-05-18 NOTE — Progress Notes (Signed)
Had pt ambulate in hall. He used walker, went with portable O2 at 2L, IV pole and Dinamap.Walked approximately 100 feet to elevators and back, sats dropped at one point so increased O2 to 4L until back in room. Pt tolerated well.
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
## 2014-05-19 LAB — BASIC METABOLIC PANEL
ANION GAP: 6 (ref 5–15)
BUN: 20 mg/dL (ref 6–23)
CHLORIDE: 106 mmol/L (ref 96–112)
CO2: 23 mmol/L (ref 19–32)
Calcium: 8.4 mg/dL (ref 8.4–10.5)
Creatinine, Ser: 1.86 mg/dL — ABNORMAL HIGH (ref 0.50–1.35)
GFR calc non Af Amer: 32 mL/min — ABNORMAL LOW (ref >90)
GFR, EST AFRICAN AMERICAN: 37 mL/min — AB (ref >90)
GLUCOSE: 89 mg/dL (ref 70–99)
Potassium: 4.2 mmol/L (ref 3.5–5.1)
SODIUM: 135 mmol/L (ref 135–145)

## 2014-05-19 MED ORDER — TRAMADOL HCL 50 MG PO TABS: 50.0000 mg | ORAL_TABLET | Freq: Four times a day (QID) | ORAL | Status: DC | PRN
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
## 2014-05-19 NOTE — Discharge Instructions (Signed)
Increase activity as pain allows.  Always have someone with you when you are up.
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
## 2014-05-19 NOTE — Progress Notes (Signed)
Discharge instructions reviewed with patient and pt's daughter at the bedside. Rx given. Pt worked with PT. Patient and family are going to have a shower and lunch before discharging.

## 2014-05-19 NOTE — Care Management Note (Signed)
  Page 1 of 1   05/19/2014     8:18:08 AM CARE MANAGEMENT NOTE 05/19/2014  Patient:  Brian Blankenship, Brian Blankenship   Account Number:  [id number]  Date Initiated:  05/19/2014  Documentation initiated by:  Magdalen Spatz  Subjective/Objective Assessment:     Action/Plan:   Anticipated DC Date:  05/19/2014   Anticipated DC Plan:  [place]         Choice offered to / List presented to:  C-1 Patient        [place] arranged  [place].   Status of service:  Completed, signed off Medicare Important Message given?  YES (If response is "NO", the following Medicare IM given date fields will be blank) Date Medicare IM given:  05/19/2014 Medicare IM given by:  Magdalen Spatz Date Additional Medicare IM given:   Additional Medicare IM given by:    Discharge Disposition:  [place]  Per UR Regulation:  Reviewed for med. necessity/level of care/duration of stay  If discussed at [place] of Stay Meetings, dates discussed:   05/19/2014    Comments:  05-19-14 Confirmed face sheet information with patient and daughter . Patient already has two walkers and bedside commode at home . Magdalen Spatz RN BSN [phone]
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
## 2014-05-21 DIAGNOSIS — E785 Hyperlipidemia, unspecified: Secondary | ICD-10-CM | POA: Diagnosis not present

## 2014-05-21 DIAGNOSIS — N400 Enlarged prostate without lower urinary tract symptoms: Secondary | ICD-10-CM | POA: Diagnosis not present

## 2014-05-21 DIAGNOSIS — I2510 Atherosclerotic heart disease of native coronary artery without angina pectoris: Secondary | ICD-10-CM | POA: Diagnosis not present

## 2014-05-21 DIAGNOSIS — L409 Psoriasis, unspecified: Secondary | ICD-10-CM | POA: Diagnosis not present

## 2014-05-21 DIAGNOSIS — K5790 Diverticulosis of intestine, part unspecified, without perforation or abscess without bleeding: Secondary | ICD-10-CM | POA: Diagnosis not present

## 2014-05-21 DIAGNOSIS — I10 Essential (primary) hypertension: Secondary | ICD-10-CM | POA: Diagnosis not present

## 2014-05-21 DIAGNOSIS — S2243XD Multiple fractures of ribs, bilateral, subsequent encounter for fracture with routine healing: Secondary | ICD-10-CM | POA: Diagnosis not present

## 2014-05-22 ENCOUNTER — Telehealth: Payer: Self-pay | Admitting: Family Medicine

## 2014-05-22 ENCOUNTER — Encounter: Payer: Self-pay | Admitting: Family

## 2014-05-22 ENCOUNTER — Ambulatory Visit (INDEPENDENT_AMBULATORY_CARE_PROVIDER_SITE_OTHER): Payer: Medicare Other | Admitting: Family

## 2014-05-22 VITALS — BP 155/70 | HR 84 | Temp 98.3°F | Ht 68.0 in | Wt 171.6 lb

## 2014-05-22 DIAGNOSIS — S2239XD Fracture of one rib, unspecified side, subsequent encounter for fracture with routine healing: Secondary | ICD-10-CM

## 2014-05-22 DIAGNOSIS — Z09 Encounter for follow-up examination after completed treatment for conditions other than malignant neoplasm: Secondary | ICD-10-CM

## 2014-05-22 NOTE — Patient Instructions (Signed)
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
## 2014-05-22 NOTE — Telephone Encounter (Signed)
appointment given for today with [facility] since Dr. Laurance Flatten is off today.

## 2014-05-25 DIAGNOSIS — S2243XD Multiple fractures of ribs, bilateral, subsequent encounter for fracture with routine healing: Secondary | ICD-10-CM | POA: Diagnosis not present

## 2014-05-25 DIAGNOSIS — E785 Hyperlipidemia, unspecified: Secondary | ICD-10-CM | POA: Diagnosis not present

## 2014-05-25 DIAGNOSIS — I10 Essential (primary) hypertension: Secondary | ICD-10-CM | POA: Diagnosis not present

## 2014-05-25 DIAGNOSIS — L409 Psoriasis, unspecified: Secondary | ICD-10-CM | POA: Diagnosis not present

## 2014-05-25 DIAGNOSIS — I2510 Atherosclerotic heart disease of native coronary artery without angina pectoris: Secondary | ICD-10-CM | POA: Diagnosis not present

## 2014-05-25 DIAGNOSIS — K5790 Diverticulosis of intestine, part unspecified, without perforation or abscess without bleeding: Secondary | ICD-10-CM | POA: Diagnosis not present

## 2014-05-25 DIAGNOSIS — N400 Enlarged prostate without lower urinary tract symptoms: Secondary | ICD-10-CM | POA: Diagnosis not present

## 2014-05-26 DIAGNOSIS — I10 Essential (primary) hypertension: Secondary | ICD-10-CM | POA: Diagnosis not present

## 2014-05-26 DIAGNOSIS — N400 Enlarged prostate without lower urinary tract symptoms: Secondary | ICD-10-CM | POA: Diagnosis not present

## 2014-05-26 DIAGNOSIS — S2243XD Multiple fractures of ribs, bilateral, subsequent encounter for fracture with routine healing: Secondary | ICD-10-CM | POA: Diagnosis not present

## 2014-05-26 DIAGNOSIS — E785 Hyperlipidemia, unspecified: Secondary | ICD-10-CM | POA: Diagnosis not present

## 2014-05-26 DIAGNOSIS — I2510 Atherosclerotic heart disease of native coronary artery without angina pectoris: Secondary | ICD-10-CM | POA: Diagnosis not present

## 2014-05-26 DIAGNOSIS — L409 Psoriasis, unspecified: Secondary | ICD-10-CM | POA: Diagnosis not present

## 2014-05-26 DIAGNOSIS — K5790 Diverticulosis of intestine, part unspecified, without perforation or abscess without bleeding: Secondary | ICD-10-CM | POA: Diagnosis not present

## 2014-05-28 ENCOUNTER — Inpatient Hospital Stay (HOSPITAL_COMMUNITY)
Admission: EM | Admit: 2014-05-28 | Discharge: 2014-06-04 | DRG: 186 | Disposition: A | Discharge status: Home Health | Payer: Medicare Other | Attending: General Surgery | Admitting: General Surgery

## 2014-05-28 ENCOUNTER — Emergency Department (HOSPITAL_COMMUNITY): Payer: Medicare Other

## 2014-05-28 ENCOUNTER — Encounter (HOSPITAL_COMMUNITY): Payer: Self-pay | Admitting: Physical Medicine and Rehabilitation

## 2014-05-28 DIAGNOSIS — I480 Paroxysmal atrial fibrillation: Secondary | ICD-10-CM | POA: Diagnosis not present

## 2014-05-28 DIAGNOSIS — J189 Pneumonia, unspecified organism: Secondary | ICD-10-CM | POA: Diagnosis present

## 2014-05-28 DIAGNOSIS — N400 Enlarged prostate without lower urinary tract symptoms: Secondary | ICD-10-CM | POA: Diagnosis not present

## 2014-05-28 DIAGNOSIS — R0602 Shortness of breath: Secondary | ICD-10-CM | POA: Diagnosis not present

## 2014-05-28 DIAGNOSIS — Z823 Family history of stroke: Secondary | ICD-10-CM | POA: Diagnosis not present

## 2014-05-28 DIAGNOSIS — I517 Cardiomegaly: Secondary | ICD-10-CM | POA: Diagnosis not present

## 2014-05-28 DIAGNOSIS — I129 Hypertensive chronic kidney disease with stage 1 through stage 4 chronic kidney disease, or unspecified chronic kidney disease: Secondary | ICD-10-CM | POA: Diagnosis not present

## 2014-05-28 DIAGNOSIS — J9383 Other pneumothorax: Secondary | ICD-10-CM | POA: Diagnosis not present

## 2014-05-28 DIAGNOSIS — E871 Hypo-osmolality and hyponatremia: Secondary | ICD-10-CM | POA: Diagnosis not present

## 2014-05-28 DIAGNOSIS — J9690 Respiratory failure, unspecified, unspecified whether with hypoxia or hypercapnia: Secondary | ICD-10-CM | POA: Diagnosis not present

## 2014-05-28 DIAGNOSIS — Z79899 Other long term (current) drug therapy: Secondary | ICD-10-CM

## 2014-05-28 DIAGNOSIS — Z91048 Other nonmedicinal substance allergy status: Secondary | ICD-10-CM | POA: Diagnosis not present

## 2014-05-28 DIAGNOSIS — S2242XD Multiple fractures of ribs, left side, subsequent encounter for fracture with routine healing: Secondary | ICD-10-CM

## 2014-05-28 DIAGNOSIS — Z886 Allergy status to analgesic agent status: Secondary | ICD-10-CM | POA: Diagnosis not present

## 2014-05-28 DIAGNOSIS — Z87891 Personal history of nicotine dependence: Secondary | ICD-10-CM | POA: Diagnosis not present

## 2014-05-28 DIAGNOSIS — J90 Pleural effusion, not elsewhere classified: Secondary | ICD-10-CM | POA: Diagnosis present

## 2014-05-28 DIAGNOSIS — Z4682 Encounter for fitting and adjustment of non-vascular catheter: Secondary | ICD-10-CM

## 2014-05-28 DIAGNOSIS — S2243XD Multiple fractures of ribs, bilateral, subsequent encounter for fracture with routine healing: Secondary | ICD-10-CM | POA: Diagnosis not present

## 2014-05-28 DIAGNOSIS — N183 Chronic kidney disease, stage 3 (moderate): Secondary | ICD-10-CM | POA: Diagnosis not present

## 2014-05-28 DIAGNOSIS — J9600 Acute respiratory failure, unspecified whether with hypoxia or hypercapnia: Secondary | ICD-10-CM

## 2014-05-28 DIAGNOSIS — J939 Pneumothorax, unspecified: Secondary | ICD-10-CM | POA: Diagnosis not present

## 2014-05-28 DIAGNOSIS — R918 Other nonspecific abnormal finding of lung field: Secondary | ICD-10-CM | POA: Diagnosis not present

## 2014-05-28 DIAGNOSIS — J942 Hemothorax: Principal | ICD-10-CM | POA: Diagnosis present

## 2014-05-28 DIAGNOSIS — R0902 Hypoxemia: Secondary | ICD-10-CM | POA: Diagnosis not present

## 2014-05-28 DIAGNOSIS — L409 Psoriasis, unspecified: Secondary | ICD-10-CM | POA: Diagnosis not present

## 2014-05-28 DIAGNOSIS — Z8249 Family history of ischemic heart disease and other diseases of the circulatory system: Secondary | ICD-10-CM | POA: Diagnosis not present

## 2014-05-28 DIAGNOSIS — S272XXA Traumatic hemopneumothorax, initial encounter: Secondary | ICD-10-CM | POA: Diagnosis not present

## 2014-05-28 DIAGNOSIS — J9811 Atelectasis: Secondary | ICD-10-CM | POA: Diagnosis not present

## 2014-05-28 DIAGNOSIS — E785 Hyperlipidemia, unspecified: Secondary | ICD-10-CM | POA: Diagnosis present

## 2014-05-28 DIAGNOSIS — D72829 Elevated white blood cell count, unspecified: Secondary | ICD-10-CM | POA: Diagnosis not present

## 2014-05-28 DIAGNOSIS — K5790 Diverticulosis of intestine, part unspecified, without perforation or abscess without bleeding: Secondary | ICD-10-CM | POA: Diagnosis not present

## 2014-05-28 DIAGNOSIS — I4891 Unspecified atrial fibrillation: Secondary | ICD-10-CM | POA: Diagnosis not present

## 2014-05-28 DIAGNOSIS — N179 Acute kidney failure, unspecified: Secondary | ICD-10-CM | POA: Diagnosis present

## 2014-05-28 DIAGNOSIS — I2510 Atherosclerotic heart disease of native coronary artery without angina pectoris: Secondary | ICD-10-CM | POA: Diagnosis not present

## 2014-05-28 DIAGNOSIS — S2242XA Multiple fractures of ribs, left side, initial encounter for closed fracture: Secondary | ICD-10-CM | POA: Diagnosis not present

## 2014-05-28 DIAGNOSIS — I10 Essential (primary) hypertension: Secondary | ICD-10-CM | POA: Diagnosis not present

## 2014-05-28 DIAGNOSIS — S2239XA Fracture of one rib, unspecified side, initial encounter for closed fracture: Secondary | ICD-10-CM | POA: Diagnosis not present

## 2014-05-28 HISTORY — DX: Unspecified atrial fibrillation: I48.91

## 2014-05-28 LAB — CBC
HCT: 39.3 % (ref 39.0–52.0)
Hemoglobin: 13.1 g/dL (ref 13.0–17.0)
MCH: 31.3 pg (ref 26.0–34.0)
MCHC: 33.3 g/dL (ref 30.0–36.0)
MCV: 93.8 fL (ref 78.0–100.0)
PLATELETS: 338 10*3/uL (ref 150–400)
RBC: 4.19 MIL/uL — ABNORMAL LOW (ref 4.22–5.81)
RDW: 12.6 % (ref 11.5–15.5)
WBC: 21.7 10*3/uL — AB (ref 4.0–10.5)

## 2014-05-28 LAB — I-STAT ARTERIAL BLOOD GAS, ED
Acid-Base Excess: 1 mmol/L (ref 0.0–2.0)
BICARBONATE: 25.1 meq/L — AB (ref 20.0–24.0)
O2 Saturation: 91 %
PCO2 ART: 37.8 mmHg (ref 35.0–45.0)
PH ART: 7.431 (ref 7.350–7.450)
TCO2: 26 mmol/L (ref 0–100)
pO2, Arterial: 58 mmHg — ABNORMAL LOW (ref 80.0–100.0)

## 2014-05-28 LAB — MRSA PCR SCREENING: MRSA BY PCR: NEGATIVE

## 2014-05-28 LAB — BASIC METABOLIC PANEL
Anion gap: 13 (ref 5–15)
BUN: 25 mg/dL — ABNORMAL HIGH (ref 6–23)
CO2: 22 mmol/L (ref 19–32)
CREATININE: 1.98 mg/dL — AB (ref 0.50–1.35)
Calcium: 8.5 mg/dL (ref 8.4–10.5)
Chloride: 95 mmol/L — ABNORMAL LOW (ref 96–112)
GFR calc Af Amer: 35 mL/min — ABNORMAL LOW (ref >90)
GFR calc non Af Amer: 30 mL/min — ABNORMAL LOW (ref >90)
Glucose, Bld: 170 mg/dL — ABNORMAL HIGH (ref 70–99)
Potassium: 3.8 mmol/L (ref 3.5–5.1)
SODIUM: 130 mmol/L — AB (ref 135–145)

## 2014-05-28 LAB — I-STAT TROPONIN, ED: TROPONIN I, POC: 0.03 ng/mL (ref 0.00–0.08)

## 2014-05-28 MED ORDER — OXYCODONE HCL 5 MG PO TABS: 2.5000 mg | ORAL_TABLET | ORAL | Status: DC | PRN

## 2014-05-28 MED ORDER — MIDAZOLAM HCL 2 MG/2ML IJ SOLN
INTRAMUSCULAR | Status: AC
  Administered 2014-05-28: 2 mg
  Filled 2014-05-28: qty 2

## 2014-05-28 MED ORDER — FENTANYL CITRATE 0.05 MG/ML IJ SOLN
INTRAMUSCULAR | Status: AC | PRN
  Administered 2014-05-28: 25 ug via INTRAVENOUS
  Administered 2014-05-28: 50 ug via INTRAVENOUS

## 2014-05-28 MED ORDER — HYDROCHLOROTHIAZIDE 10 MG/ML ORAL SUSPENSION
6.2500 mg | Freq: Every day | ORAL | Status: DC
  Administered 2014-05-28 – 2014-05-30 (×3): 6.25 mg via ORAL
  Filled 2014-05-28 (×4): qty 1.25

## 2014-05-28 MED ORDER — ONDANSETRON HCL 4 MG PO TABS: 4.0000 mg | ORAL_TABLET | Freq: Four times a day (QID) | ORAL | Status: DC | PRN

## 2014-05-28 MED ORDER — FENTANYL CITRATE 0.05 MG/ML IJ SOLN
50.0000 ug | Freq: Once | INTRAMUSCULAR | Status: AC
  Administered 2014-05-28: 50 ug via INTRAVENOUS
  Filled 2014-05-28: qty 2

## 2014-05-28 MED ORDER — ENOXAPARIN SODIUM 30 MG/0.3ML ~~LOC~~ SOLN
30.0000 mg | SUBCUTANEOUS | Status: DC
  Administered 2014-05-28 – 2014-06-03 (×7): 30 mg via SUBCUTANEOUS
  Filled 2014-05-28 (×8): qty 0.3

## 2014-05-28 MED ORDER — OXYCODONE HCL 5 MG PO TABS: 10.0000 mg | ORAL_TABLET | ORAL | Status: DC | PRN

## 2014-05-28 MED ORDER — ATORVASTATIN CALCIUM 20 MG PO TABS
20.0000 mg | ORAL_TABLET | Freq: Every day | ORAL | Status: DC
  Administered 2014-05-28 – 2014-06-04 (×8): 20 mg via ORAL
  Filled 2014-05-28 (×8): qty 1

## 2014-05-28 MED ORDER — BISACODYL 10 MG RE SUPP: 10.0000 mg | Freq: Every day | RECTAL | Status: DC | PRN

## 2014-05-28 MED ORDER — IRBESARTAN 75 MG PO TABS
75.0000 mg | ORAL_TABLET | Freq: Every day | ORAL | Status: DC
  Administered 2014-05-28 – 2014-05-30 (×3): 75 mg via ORAL
  Filled 2014-05-28 (×3): qty 1

## 2014-05-28 MED ORDER — ENOXAPARIN SODIUM 40 MG/0.4ML ~~LOC~~ SOLN
40.0000 mg | SUBCUTANEOUS | Status: DC
  Filled 2014-05-28 (×2): qty 0.4

## 2014-05-28 MED ORDER — ADULT MULTIVITAMIN W/MINERALS CH
1.0000 | ORAL_TABLET | Freq: Every day | ORAL | Status: DC
  Administered 2014-05-29 – 2014-06-04 (×7): 1 via ORAL
  Filled 2014-05-28 (×8): qty 1

## 2014-05-28 MED ORDER — OXYCODONE HCL 5 MG PO TABS
5.0000 mg | ORAL_TABLET | ORAL | Status: DC | PRN
  Administered 2014-05-28 – 2014-06-02 (×9): 5 mg via ORAL
  Filled 2014-05-28 (×9): qty 1

## 2014-05-28 MED ORDER — LIDOCAINE-EPINEPHRINE 1 %-1:100000 IJ SOLN
20.0000 mL | Freq: Once | INTRAMUSCULAR | Status: AC
  Administered 2014-05-28: 20 mL
  Filled 2014-05-28: qty 1

## 2014-05-28 MED ORDER — MIDAZOLAM HCL 2 MG/2ML IJ SOLN
INTRAMUSCULAR | Status: AC | PRN
  Administered 2014-05-28: 2 mg via INTRAVENOUS

## 2014-05-28 MED ORDER — VALSARTAN-HYDROCHLOROTHIAZIDE 160-12.5 MG PO TABS: 0.5000 | ORAL_TABLET | Freq: Every day | ORAL | Status: DC

## 2014-05-28 MED ORDER — ALLOPURINOL 100 MG PO TABS
100.0000 mg | ORAL_TABLET | Freq: Every day | ORAL | Status: DC
  Administered 2014-05-28 – 2014-06-04 (×7): 100 mg via ORAL
  Filled 2014-05-28 (×8): qty 1

## 2014-05-28 MED ORDER — POLYETHYLENE GLYCOL 3350 17 G PO PACK
17.0000 g | PACK | Freq: Every day | ORAL | Status: DC
  Administered 2014-05-28 – 2014-06-03 (×7): 17 g via ORAL
  Filled 2014-05-28 (×9): qty 1

## 2014-05-28 MED ORDER — FENTANYL CITRATE 0.05 MG/ML IJ SOLN
INTRAMUSCULAR | Status: AC
  Administered 2014-05-28: 25 ug
  Filled 2014-05-28: qty 2

## 2014-05-28 MED ORDER — ONDANSETRON HCL 4 MG/2ML IJ SOLN
4.0000 mg | Freq: Four times a day (QID) | INTRAMUSCULAR | Status: DC | PRN
  Administered 2014-05-29: 4 mg via INTRAVENOUS

## 2014-05-28 MED ORDER — TRAMADOL HCL 50 MG PO TABS
50.0000 mg | ORAL_TABLET | Freq: Four times a day (QID) | ORAL | Status: DC | PRN
  Administered 2014-06-02: 100 mg via ORAL
  Filled 2014-05-28: qty 2

## 2014-05-28 MED ORDER — MORPHINE SULFATE 2 MG/ML IJ SOLN
2.0000 mg | INTRAMUSCULAR | Status: DC | PRN
  Administered 2014-05-30: 2 mg via INTRAVENOUS
  Filled 2014-05-28: qty 1

## 2014-05-28 MED ORDER — MIDAZOLAM HCL 2 MG/2ML IJ SOLN
INTRAMUSCULAR | Status: AC
  Filled 2014-05-28: qty 2
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
## 2014-05-28 NOTE — ED Notes (Signed)
Family at bedside. 

## 2014-05-28 NOTE — ED Provider Notes (Signed)
CXR reviewed Pt with new oxygen requirement Trauma has been consulted   Ripley Fraise, MD 05/28/14 1048

## 2014-05-28 NOTE — ED Notes (Signed)
Assisted Hayley, RN with changing pt's stretcher linens and gown; family stepped out of room
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
## 2014-05-28 NOTE — Sedation Documentation (Signed)
MD at beside, numbing patient, preparing to start precedure.
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
## 2014-05-28 NOTE — ED Notes (Signed)
Pt resting in bed, NAD at this time.  Will continue to monitor closely.

## 2014-05-28 NOTE — Sedation Documentation (Signed)
CT hooked to suction by MD

## 2014-05-28 NOTE — Sedation Documentation (Signed)
MD suturing CT.

## 2014-05-28 NOTE — Sedation Documentation (Signed)
Approximately 900cc blood out per MD.
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
## 2014-05-29 ENCOUNTER — Inpatient Hospital Stay (HOSPITAL_COMMUNITY): Payer: Medicare Other

## 2014-05-29 DIAGNOSIS — J942 Hemothorax: Secondary | ICD-10-CM

## 2014-05-29 LAB — CBC
HCT: 36.1 % — ABNORMAL LOW (ref 39.0–52.0)
HEMOGLOBIN: 12.3 g/dL — AB (ref 13.0–17.0)
MCH: 31.6 pg (ref 26.0–34.0)
MCHC: 34.1 g/dL (ref 30.0–36.0)
MCV: 92.8 fL (ref 78.0–100.0)
PLATELETS: 290 10*3/uL (ref 150–400)
RBC: 3.89 MIL/uL — AB (ref 4.22–5.81)
RDW: 12.6 % (ref 11.5–15.5)
WBC: 20.8 10*3/uL — ABNORMAL HIGH (ref 4.0–10.5)

## 2014-05-29 LAB — BASIC METABOLIC PANEL
ANION GAP: 12 (ref 5–15)
BUN: 26 mg/dL — ABNORMAL HIGH (ref 6–23)
CO2: 24 mmol/L (ref 19–32)
Calcium: 8.1 mg/dL — ABNORMAL LOW (ref 8.4–10.5)
Chloride: 97 mmol/L (ref 96–112)
Creatinine, Ser: 1.9 mg/dL — ABNORMAL HIGH (ref 0.50–1.35)
GFR calc non Af Amer: 31 mL/min — ABNORMAL LOW (ref >90)
GFR, EST AFRICAN AMERICAN: 36 mL/min — AB (ref >90)
GLUCOSE: 103 mg/dL — AB (ref 70–99)
Potassium: 4 mmol/L (ref 3.5–5.1)
Sodium: 133 mmol/L — ABNORMAL LOW (ref 135–145)
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
## 2014-05-29 NOTE — Progress Notes (Signed)
UR completed.  Lamonte Hartt, RN BSN [facility] CCM Trauma/Neuro ICU Case Manager [phone]  

## 2014-05-29 NOTE — Progress Notes (Signed)
UR completed.  Aliene Tamura, RN BSN [facility] CCM Trauma/Neuro ICU Case Manager [phone]  
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
## 2014-05-30 ENCOUNTER — Inpatient Hospital Stay (HOSPITAL_COMMUNITY): Payer: Medicare Other

## 2014-05-30 DIAGNOSIS — I4891 Unspecified atrial fibrillation: Secondary | ICD-10-CM

## 2014-05-30 DIAGNOSIS — J942 Hemothorax: Principal | ICD-10-CM

## 2014-05-30 LAB — CK TOTAL AND CKMB (NOT AT ARMC)
CK TOTAL: 26 U/L (ref 7–232)
CK, MB: 3 ng/mL (ref 0.3–4.0)
RELATIVE INDEX: INVALID (ref 0.0–2.5)

## 2014-05-30 MED ORDER — AMIODARONE HCL IN DEXTROSE 360-4.14 MG/200ML-% IV SOLN
60.0000 mg/h | INTRAVENOUS | Status: AC
  Administered 2014-05-30 (×2): 60 mg/h via INTRAVENOUS
  Filled 2014-05-30: qty 200

## 2014-05-30 MED ORDER — VANCOMYCIN HCL IN DEXTROSE 1-5 GM/200ML-% IV SOLN
1000.0000 mg | Freq: Once | INTRAVENOUS | Status: AC
  Administered 2014-05-30: 1000 mg via INTRAVENOUS
  Filled 2014-05-30: qty 200

## 2014-05-30 MED ORDER — METOPROLOL TARTRATE 1 MG/ML IV SOLN
5.0000 mg | Freq: Once | INTRAVENOUS | Status: AC
  Administered 2014-05-30: 5 mg via INTRAVENOUS

## 2014-05-30 MED ORDER — METOPROLOL TARTRATE 1 MG/ML IV SOLN
2.5000 mg | Freq: Once | INTRAVENOUS | Status: AC
  Administered 2014-05-30: 2.5 mg via INTRAVENOUS
  Filled 2014-05-30: qty 5

## 2014-05-30 MED ORDER — LEVOFLOXACIN IN D5W 750 MG/150ML IV SOLN
750.0000 mg | INTRAVENOUS | Status: DC
  Administered 2014-05-30 – 2014-06-01 (×2): 750 mg via INTRAVENOUS
  Filled 2014-05-30 (×2): qty 150

## 2014-05-30 MED ORDER — AMIODARONE HCL 200 MG PO TABS
200.0000 mg | ORAL_TABLET | Freq: Two times a day (BID) | ORAL | Status: DC
  Administered 2014-05-30: 200 mg via ORAL
  Filled 2014-05-30 (×4): qty 1

## 2014-05-30 MED ORDER — AMIODARONE LOAD VIA INFUSION
150.0000 mg | Freq: Once | INTRAVENOUS | Status: AC
  Administered 2014-05-30: 150 mg via INTRAVENOUS
  Filled 2014-05-30: qty 83.34

## 2014-05-30 MED ORDER — SODIUM CHLORIDE 0.9 % IV BOLUS (SEPSIS)
500.0000 mL | Freq: Once | INTRAVENOUS | Status: AC
  Administered 2014-05-30: 500 mL via INTRAVENOUS

## 2014-05-30 MED ORDER — VANCOMYCIN HCL IN DEXTROSE 750-5 MG/150ML-% IV SOLN
750.0000 mg | INTRAVENOUS | Status: DC
  Administered 2014-05-31 – 2014-06-02 (×3): 750 mg via INTRAVENOUS
  Filled 2014-05-30 (×4): qty 150

## 2014-05-30 MED ORDER — DEXTROSE-NACL 5-0.9 % IV SOLN
INTRAVENOUS | Status: DC
  Administered 2014-05-30: 80 mL/h via INTRAVENOUS
  Administered 2014-05-31 – 2014-06-02 (×4): via INTRAVENOUS

## 2014-05-30 MED ORDER — AMIODARONE HCL IN DEXTROSE 360-4.14 MG/200ML-% IV SOLN
30.0000 mg/h | INTRAVENOUS | Status: DC
  Administered 2014-05-30 – 2014-06-01 (×4): 30 mg/h via INTRAVENOUS
  Filled 2014-05-30 (×8): qty 200

## 2014-05-30 MED ORDER — METOPROLOL TARTRATE 1 MG/ML IV SOLN
2.5000 mg | Freq: Once | INTRAVENOUS | Status: AC
  Administered 2014-05-30: 2.5 mg via INTRAVENOUS

## 2014-05-30 MED ORDER — METOPROLOL TARTRATE 1 MG/ML IV SOLN
2.5000 mg | Freq: Once | INTRAVENOUS | Status: AC
Start: 2014-05-30 — End: 2014-05-30
  Administered 2014-05-30: 2.5 mg via INTRAVENOUS
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
## 2014-05-30 NOTE — Progress Notes (Signed)
Notified Dr. Barry Dienes of patient's irregular and increasing HR going in and out of afib. EKG performed. Dr. Barry Dienes reviewed. Orders received for metoprolol 2.5mg  one time and to repeat. Will monitor.

## 2014-05-30 NOTE — Progress Notes (Signed)
Patient's HR decreased for a couple of hours to 90's and low 100's. Rhythm continues to be in and out of afib and NSR but with greater periods of sinus than previously. Dr. Barry Dienes notified. Order received for 5mg  of metoprolol. Will monitor.

## 2014-05-30 NOTE — Progress Notes (Addendum)
  Subjective: Chest wall soreness in back. Breathing ok  Objective: Vital signs in last 24 hours: Temp:  [98.4 F (36.9 C)-99.2 F (37.3 C)] 98.4 F (36.9 C) (04/09 1139) Pulse Rate:  [36-147] 86 (04/09 1230) Cardiac Rhythm:  [-] Sinus tachycardia (04/09 1200) Resp:  [19-34] 19 (04/09 1230) BP: (72-155)/(31-58) 90/34 mmHg (04/09 1230) SpO2:  [91 %-100 %] 100 % (04/09 1230)  Hemodynamic parameters for last 24 hours:    Intake/Output from previous day: 04/08 0701 - 04/09 0700 In: -  Out: 500 [Urine:500] Intake/Output this shift: Total I/O In: 582.7 [I.V.:82.7; IV Piggyback:500] Out: 0   General appearance: alert and cooperative Heart: irregularly irregular rhythm Lungs: diminished breath sounds LLL  Lab Results:  Recent Labs  05/28/14 0953 05/29/14 0220  WBC 21.7* 20.8*  HGB 13.1 12.3*  HCT 39.3 36.1*  PLT 338 290   BMET:  Recent Labs  05/28/14 0953 05/29/14 0220  NA 130* 133*  K 3.8 4.0  CL 95* 97  CO2 22 24  GLUCOSE 170* 103*  BUN 25* 26*  CREATININE 1.98* 1.90*  CALCIUM 8.5 8.1*    PT/INR: No results for input(s): LABPROT, INR in the last 72 hours. ABG    Component Value Date/Time   PHART 7.431 05/28/2014 1038   HCO3 25.1* 05/28/2014 1038   TCO2 26 05/28/2014 1038   O2SAT 91.0 05/28/2014 1038   CBG (last 3)  No results for input(s): GLUCAP in the last 72 hours.  CLINICAL DATA: Hemothorax on the left. Chest tube in place.  EXAM: CT CHEST WITHOUT CONTRAST  TECHNIQUE: Multidetector CT imaging of the chest was performed following the standard protocol without IV contrast.  COMPARISON: Radiograph stress that CT 05/13/2014, radiograph 05/29/2014  FINDINGS: Mediastinum/Nodes: No axillary or supraclavicular adenopathy. Small paratracheal lymph nodes present. Esophagus is normal. No pericardial fluid. Coronary artery calcifications are present.  Lungs/Pleura: Left chest tube extends along the oblique fissure into the upper left  hemi thorax. A tiny pneumothorax laterally at the left lung apex (image 14, series 205). There is a small to moderate left pleural effusion. There is dense consolidation within the left lower lobe with air bronchograms. Right lung is clear.  Upper abdomen: Low-density lesions within the liver likely represent cysts.  There is a subcapsular fluid collection along the lateral margin of the right kidney entirety kidney is not imaged. This subcapsular collection is relatively low density. Findings not changed from 05/13/2014.  Musculoskeletal: Multiple left lateral rib fractures again noted.  IMPRESSION: 1. Dense left lower lobe consolidation with air bronchograms is most consistent with left lower lobe pneumonia. 2. Trace left pneumothorax with chest tube in place with moderate left pleural effusion. 3. Multiple left rib fractures. 4. Right subcapsular perinephric hematoma not changed.   Electronically Signed  By: Suzy Bouchard M.D.  On: 05/30/2014 10:01 Assessment/Plan:  I have personally reviewed his CT scan. There is only a small amount of residual left pleural effusion. There is dense consolidation of the LLL which is probably partly contusion and partly pneumonia. The chest tube is not draining much so it could be removed. I don't think he needs surgical drainage of the left chest. He does need antibiotics for possible pneumonia and continued attention to pulmonary toilet to clear the LLL. I will ask pharmacy to start Levaquin dosed for his renal function. Leukocytosis is likely due to left lung consolidation.   LOS: 2 days    Gaye Pollack 05/30/2014
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
## 2014-05-30 NOTE — Progress Notes (Signed)
Dear Doctor: Hulen Skains* This patient has been identified as a candidate for PICC for the following reason (s): drug pH or osmolality (causing phlebitis, infiltration in 24 hours) If you agree, please write an order for the indicated device. For any questions contact the Vascular Access Team at [phone] if no answer, please leave a message.  Thank you for supporting the early vascular access assessment program.

## 2014-05-30 NOTE — Progress Notes (Signed)
Pt HR maintaining 140-160's, paged Trauma MD, gave orders for EKG, cardiac enzyme labs, betablocker and said that he would call Cardiology to assess pt.

## 2014-05-31 ENCOUNTER — Inpatient Hospital Stay (HOSPITAL_COMMUNITY): Payer: Medicare Other

## 2014-05-31 DIAGNOSIS — I4891 Unspecified atrial fibrillation: Secondary | ICD-10-CM

## 2014-05-31 DIAGNOSIS — I480 Paroxysmal atrial fibrillation: Secondary | ICD-10-CM

## 2014-05-31 LAB — BASIC METABOLIC PANEL
Anion gap: 14 (ref 5–15)
BUN: 47 mg/dL — ABNORMAL HIGH (ref 6–23)
CALCIUM: 7.5 mg/dL — AB (ref 8.4–10.5)
CHLORIDE: 97 mmol/L (ref 96–112)
CO2: 20 mmol/L (ref 19–32)
Creatinine, Ser: 2.36 mg/dL — ABNORMAL HIGH (ref 0.50–1.35)
GFR calc Af Amer: 28 mL/min — ABNORMAL LOW (ref >90)
GFR calc non Af Amer: 24 mL/min — ABNORMAL LOW (ref >90)
Glucose, Bld: 173 mg/dL — ABNORMAL HIGH (ref 70–99)
Potassium: 3.7 mmol/L (ref 3.5–5.1)
Sodium: 131 mmol/L — ABNORMAL LOW (ref 135–145)

## 2014-05-31 NOTE — Progress Notes (Signed)
Patient ID: Brian Blankenship, male   DOB: 11/27/1930, 81 y.o.   MRN: [id number]    Subjective: Pt denies shortness of breath.  Does have left sided pain.    Objective: Vital signs in last 24 hours: Temp:  [98.4 F (36.9 C)-99.2 F (37.3 C)] 98.6 F (37 C) (04/10 0800) Pulse Rate:  [73-158] 89 (04/10 0800) Resp:  [15-30] 23 (04/10 0800) BP: (55-133)/(12-66) 128/51 mmHg (04/10 0800) SpO2:  [92 %-100 %] 96 % (04/10 0800) Last BM Date: 05/25/14  Intake/Output from previous day: 04/09 0701 - 04/10 0700 In: 2759.4 [I.V.:1909.4; IV Piggyback:850] Out: 550 [Urine:550] Intake/Output this shift: Total I/O In: 80 [I.V.:80] Out: 315 [Urine:315]  General appearance: alert and cooperative Resp: breathing comfortably Chest wall: left sided chest wall tenderness Cardio: sinus rhythm GI: soft, non-tender; bowel sounds normal; no masses Extremities: extremities normal, atraumatic, no cyanosis or edema  Lab Results:   Recent Labs  05/28/14 0953 05/29/14 0220  WBC 21.7* 20.8*  HGB 13.1 12.3*  HCT 39.3 36.1*  PLT 338 290   BMET  Recent Labs  05/29/14 0220 05/31/14 0345  NA 133* 131*  K 4.0 3.7  CL 97 97  CO2 24 20  GLUCOSE 103* 173*  BUN 26* 47*  CREATININE 1.90* 2.36*  CALCIUM 8.1* 7.5*   PT/INR No results for input(s): LABPROT, INR in the last 72 hours. ABG  Recent Labs  05/28/14 1038  PHART 7.431  HCO3 25.1*    Studies/Results: Ct Chest Wo Contrast  05/30/2014   CLINICAL DATA:  Hemothorax on the left.  Chest tube in place.  EXAM: CT CHEST WITHOUT CONTRAST  TECHNIQUE: Multidetector CT imaging of the chest was performed following the standard protocol without IV contrast.  COMPARISON:  Radiograph stress that CT 05/13/2014, radiograph 05/29/2014  FINDINGS: Mediastinum/Nodes: No axillary or supraclavicular adenopathy. Small paratracheal lymph nodes present. Esophagus is normal. No pericardial fluid. Coronary artery calcifications are present.  Lungs/Pleura: Left chest tube  extends along the oblique fissure into the upper left hemi thorax. A tiny pneumothorax laterally at the left lung apex (image 14, series 205). There is a small to moderate left pleural effusion. There is dense consolidation within the left lower lobe with air bronchograms. Right lung is clear.  Upper abdomen: Low-density lesions within the liver likely represent cysts.  There is a subcapsular fluid collection along the lateral margin of the right kidney entirety kidney is not imaged. This subcapsular collection is relatively low density. Findings not changed from 05/13/2014.  Musculoskeletal: Multiple left lateral rib fractures again noted.  IMPRESSION: 1. Dense left lower lobe consolidation with air bronchograms is most consistent with left lower lobe pneumonia. 2. Trace left pneumothorax with chest tube in place with moderate left pleural effusion. 3. Multiple left rib fractures. 4. Right subcapsular perinephric hematoma not changed.   Electronically Signed   By: Suzy Bouchard M.D.   On: 05/30/2014 10:01    Anti-infectives: Anti-infectives    Start     Dose/Rate Route Frequency Ordered Stop   05/31/14 1800  vancomycin (VANCOCIN) IVPB 750 mg/150 ml premix     750 mg 150 mL/hr over 60 Minutes Intravenous Every 24 hours 05/30/14 1552     05/30/14 1700  vancomycin (VANCOCIN) IVPB 1000 mg/200 mL premix     1,000 mg 200 mL/hr over 60 Minutes Intravenous  Once 05/30/14 1552 05/30/14 1808   05/30/14 1600  levofloxacin (LEVAQUIN) IVPB 750 mg     750 mg 100 mL/hr over 90 Minutes Intravenous  Every 48 hours 05/30/14 1448        Assessment/Plan: Left Hemopneumothorax/Lt rib fracture Change CT to water seal.  Plan remove tomorrow if stable.  CVVTS FOLLOWING.   CV  Afib with RVR  Improved on IV amiodarone.   MMP-home meds Leukocytosis- likely secondary to left sided pneumonia seen on CT scan.   Acute on CKD-avoid nephrotoxins. BMP to be followed.   FEN-resume diet, miralax, PRN dulcolax, PO pain  meds VTE prophylaxis-SCD/lovenox Dispo- if stable today, can prob go to tele bed tomorrow.    LOS: 3 days    [facility] 05/31/2014

## 2014-05-31 NOTE — Progress Notes (Signed)
Patient ID: Brian Blankenship, male   DOB: 01/02/1931, 81 y.o.   MRN: [id number]    Subjective:  Denies SSCP, palpitations or Dyspnea Discussed care with son    Objective:  Filed Vitals:   05/31/14 0800 05/31/14 0900 05/31/14 1000 05/31/14 1100  BP: 128/51 125/46 126/48 128/51  Pulse: 89 86 85 86  Temp: 98.6 F (37 C)     TempSrc: Oral     Resp: 23 16 20 24   Height:      Weight:      SpO2: 96% 94% 96% 96%    Intake/Output from previous day:  Intake/Output Summary (Last 24 hours) at 05/31/14 1126 Last data filed at 05/31/14 1100  Gross per 24 hour  Intake 3319.44 ml  Output    865 ml  Net 2454.44 ml    Physical Exam: Affect appropriate Healthy:  appears stated age HEENT: normal Neck supple with no adenopathy JVP normal no bruits no thyromegaly Lungs decreased BS left base with chest tube in place  Heart:  S1/S2 no murmur, no rub, gallop or click PMI normal Abdomen: benighn, BS positve, no tenderness, no AAA no bruit.  No HSM or HJR Distal pulses intact with no bruits No edema Neuro non-focal Skin warm and dry No muscular weakness   Lab Results: Basic Metabolic Panel:  Recent Labs  05/29/14 0220 05/31/14 0345  NA 133* 131*  K 4.0 3.7  CL 97 97  CO2 24 20  GLUCOSE 103* 173*  BUN 26* 47*  CREATININE 1.90* 2.36*  CALCIUM 8.1* 7.5*   CBC:  Recent Labs  05/29/14 0220  WBC 20.8*  HGB 12.3*  HCT 36.1*  MCV 92.8  PLT 290   Cardiac Enzymes:  Recent Labs  05/30/14 1015  CKTOTAL 26  CKMB 3.0    Imaging: Ct Chest Wo Contrast  05/30/2014   CLINICAL DATA:  Hemothorax on the left.  Chest tube in place.  EXAM: CT CHEST WITHOUT CONTRAST  TECHNIQUE: Multidetector CT imaging of the chest was performed following the standard protocol without IV contrast.  COMPARISON:  Radiograph stress that CT 05/13/2014, radiograph 05/29/2014  FINDINGS: Mediastinum/Nodes: No axillary or supraclavicular adenopathy. Small paratracheal lymph nodes present. Esophagus is normal.  No pericardial fluid. Coronary artery calcifications are present.  Lungs/Pleura: Left chest tube extends along the oblique fissure into the upper left hemi thorax. A tiny pneumothorax laterally at the left lung apex (image 14, series 205). There is a small to moderate left pleural effusion. There is dense consolidation within the left lower lobe with air bronchograms. Right lung is clear.  Upper abdomen: Low-density lesions within the liver likely represent cysts.  There is a subcapsular fluid collection along the lateral margin of the right kidney entirety kidney is not imaged. This subcapsular collection is relatively low density. Findings not changed from 05/13/2014.  Musculoskeletal: Multiple left lateral rib fractures again noted.  IMPRESSION: 1. Dense left lower lobe consolidation with air bronchograms is most consistent with left lower lobe pneumonia. 2. Trace left pneumothorax with chest tube in place with moderate left pleural effusion. 3. Multiple left rib fractures. 4. Right subcapsular perinephric hematoma not changed.   Electronically Signed   By: Suzy Bouchard M.D.   On: 05/30/2014 10:01   Dg Chest Port 1 View  05/31/2014   CLINICAL DATA:  Pneumothorax, left side chest tube  EXAM: PORTABLE CHEST - 1 VIEW  COMPARISON:  05/29/2014  FINDINGS: Cardiomediastinal silhouette is stable. Left chest tube is unchanged in position. Multiple  displaced left rib fractures again noted. Again noted opacification of mid and lower left hemi thorax probable combination of pleural effusion with left lower lobe infiltrate or lung contusion. There is no pneumothorax. Right lung is clear.  IMPRESSION: Left chest tube is unchanged in position. Multiple displaced left rib fractures again noted. Again noted opacification of mid and lower left hemi thorax probable combination of pleural effusion with left lower lobe infiltrate or lung contusion. There is no pneumothorax. Right lung is clear.   Electronically Signed   By:  Lahoma Crocker M.D.   On: 05/31/2014 09:23    Cardiac Studies:  ECG:    Telemetry:  NSR no afib 05/31/2014   Echo:  Reviewed from today    Study Conclusions  - Left ventricle: The cavity size was normal. Systolic function was normal. The estimated ejection fraction was in the range of 55% to 60%. Wall motion was normal; there were no regional wall motion abnormalities. - Aortic valve: Calcified noncoronary cusp. There was trivial regurgitation. - Pulmonary arteries: PA peak pressure: 41 mm Hg (S). - Pericardium, extracardiac: A trivial pericardial effusion was identified.  Medications:   . allopurinol  100 mg Oral Daily  . amiodarone  200 mg Oral BID  . atorvastatin  20 mg Oral Daily  . enoxaparin (LOVENOX) injection  30 mg Subcutaneous Q24H  . levofloxacin (LEVAQUIN) IV  750 mg Intravenous Q48H  . multivitamin with minerals  1 tablet Oral Daily  . polyethylene glycol  17 g Oral Daily  . vancomycin  750 mg Intravenous Q24H     . amiodarone 30 mg/hr (05/31/14 0728)  . dextrose 5 % and 0.9% NaCl 80 mL/hr at 05/31/14 1100    Assessment/Plan:  PAF.  On iv amiodarone  Continue for 48 hrs Follow QT given Rx with levaquin.  ( Would be nice to stop this antibiotic if possible) No anticoagulation given SAH and hemothorax despite CHADVASC 3  Echo is essentially normal   Pulmonary:  Continue antibiotics pain control for fractured ribs and empiric antibiotics ? Stop levaquin while on amiodarone if possible  Will write for ECG in am   Jenkins Rouge 05/31/2014, 11:26 AM

## 2014-05-31 NOTE — Progress Notes (Signed)
  Echocardiogram 2D Echocardiogram has been performed.  Brian Blankenship 05/31/2014, 9:08 AM

## 2014-06-01 LAB — BASIC METABOLIC PANEL
ANION GAP: 7 (ref 5–15)
BUN: 38 mg/dL — ABNORMAL HIGH (ref 6–23)
CO2: 26 mmol/L (ref 19–32)
CREATININE: 2 mg/dL — AB (ref 0.50–1.35)
Calcium: 7.7 mg/dL — ABNORMAL LOW (ref 8.4–10.5)
Chloride: 102 mmol/L (ref 96–112)
GFR calc Af Amer: 34 mL/min — ABNORMAL LOW (ref >90)
GFR calc non Af Amer: 30 mL/min — ABNORMAL LOW (ref >90)
Glucose, Bld: 131 mg/dL — ABNORMAL HIGH (ref 70–99)
Potassium: 3.7 mmol/L (ref 3.5–5.1)
Sodium: 135 mmol/L (ref 135–145)

## 2014-06-01 LAB — CBC
HCT: 29.8 % — ABNORMAL LOW (ref 39.0–52.0)
HEMOGLOBIN: 10.1 g/dL — AB (ref 13.0–17.0)
MCH: 31.2 pg (ref 26.0–34.0)
MCHC: 33.9 g/dL (ref 30.0–36.0)
MCV: 92 fL (ref 78.0–100.0)
Platelets: 289 10*3/uL (ref 150–400)
RBC: 3.24 MIL/uL — ABNORMAL LOW (ref 4.22–5.81)
RDW: 12.6 % (ref 11.5–15.5)
WBC: 10.5 10*3/uL (ref 4.0–10.5)

## 2014-06-01 MED ORDER — AMIODARONE HCL 200 MG PO TABS
400.0000 mg | ORAL_TABLET | Freq: Two times a day (BID) | ORAL | Status: DC
  Administered 2014-06-01 – 2014-06-02 (×3): 400 mg via ORAL
  Filled 2014-06-01 (×4): qty 2

## 2014-06-01 NOTE — Progress Notes (Signed)
Spoke with Dr. Mare Ferrari about whether to dc amio drip now or wait until 1800 today in reference to a note from Dr. Johnsie Cancel who wanted to run the [facility] for 48 hours.   Pt will need PICC line if amio drip is to continue d/t its incompatibility with other IV meds.  Dr. Mare Ferrari instructed RN to dc amio drip now and transition to po amio.  Placed a call to IV team to cancel PICC line placement. Order will be dc'd in the system as well.  Will continue to monitor QTC since pt orders for Levaquin and amio continue.   Gregg Winchell GARNER
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
## 2014-06-01 NOTE — Clinical Documentation Improvement (Signed)
Supporting Information: Acute on chronic kidney disease per 4/09 progress notes.   Labs: Bun/   Creat/    GFR: 4/11: 38/   2.00/   30 4/10: 47/   2.36/   24    Possible Clinical Condition: . Document the stage of CKD --Chronic kidney disease, stage 1- GFR > OR = 90 --Chronic kidney disease, stage 2 (mild) - GFR 60-89 --Chronic kidney disease, stage 3 (moderate) - GFR 30-59 --Chronic kidney disease, stage 4 (severe) - GFR 15-29 --Chronic kidney disease, stage 5- GFR < 15 --End-stage renal disease (ESRD) . Document any underlying cause of CKD such as Diabetes or Hypertension . Document if the patient is dependent on Dialysis . Chronic renal failure without a documented stage will be assigned to Chronic kidney disease, unspecified . Document any associated diagnoses/conditions     Thank Sherian Maroon Documentation Specialist [phone] Zakary [email or web address]
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
## 2014-06-02 ENCOUNTER — Inpatient Hospital Stay (HOSPITAL_COMMUNITY): Payer: Medicare Other

## 2014-06-02 LAB — TSH: TSH: 0.899 u[IU]/mL (ref 0.350–4.500)

## 2014-06-02 LAB — T4, FREE: Free T4: 1.18 ng/dL (ref 0.80–1.80)

## 2014-06-02 MED ORDER — AMIODARONE HCL 200 MG PO TABS
200.0000 mg | ORAL_TABLET | Freq: Two times a day (BID) | ORAL | Status: DC
  Administered 2014-06-02 – 2014-06-04 (×4): 200 mg via ORAL
  Filled 2014-06-02 (×5): qty 1

## 2014-06-02 MED ORDER — MAGNESIUM CITRATE PO SOLN
1.0000 | Freq: Once | ORAL | Status: AC
  Administered 2014-06-02: 1 via ORAL
  Filled 2014-06-02: qty 296

## 2014-06-02 MED ORDER — DEXTROSE 5 % IV SOLN
2.0000 g | INTRAVENOUS | Status: DC
  Administered 2014-06-02: 2 g via INTRAVENOUS
  Filled 2014-06-02 (×2): qty 2

## 2014-06-02 MED ORDER — ENSURE ENLIVE PO LIQD
237.0000 mL | Freq: Two times a day (BID) | ORAL | Status: DC
  Administered 2014-06-02 – 2014-06-04 (×4): 237 mL via ORAL

## 2014-06-02 NOTE — Progress Notes (Signed)
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 Garald Balding M.D.   On: 05/16/2014 12:17    ASSESSMENT AND PLAN  1. Paroxysmal atrial fibrillation. Tolerating amiodarone. Will reduce to 200 mg BID. Thyroid function normal. No anticoagulation given SAH and hemothorax despite CHADVASC 3. Echo is essentially normal   Plan: Okay to move to telemetry from cardiac standpoint.  Signed, Darlin Coco MD
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
## 2014-06-02 NOTE — Progress Notes (Signed)
Patient ID: Brian Blankenship, male   DOB: 1931/01/14, 81 y.o.   MRN: [id number]    Subjective: Up in chair. C/O not much appetite and has not had a BM. Passing a lot of gas. Working with IS.  Objective: Vital signs in last 24 hours: Temp:  [98.2 F (36.8 C)-99 F (37.2 C)] 98.2 F (36.8 C) (04/12 0809) Pulse Rate:  [76-90] 76 (04/12 0800) Resp:  [17-29] 19 (04/12 0800) BP: (128-150)/(43-61) 138/55 mmHg (04/12 0800) SpO2:  [84 %-100 %] 99 % (04/12 0800) Last BM Date: 05/25/14  Intake/Output from previous day: 04/11 0701 - 04/12 0700 In: 2783.5 [P.O.:480; I.V.:2003.5; IV Piggyback:300] Out: 500 [Urine:500] Intake/Output this shift: Total I/O In: 80 [I.V.:80] Out: 150 [Urine:150]  General appearance: cooperative Resp: desreased at L base Cardio: regular rate and rhythm GI: soft, NT, active BS Neuro: F/C  Lab Results: CBC   Recent Labs  06/01/14 0221  WBC 10.5  HGB 10.1*  HCT 29.8*  PLT 289   BMET  Recent Labs  05/31/14 0345 06/01/14 0221  NA 131* 135  K 3.7 3.7  CL 97 102  CO2 20 26  GLUCOSE 173* 131*  BUN 47* 38*  CREATININE 2.36* 2.00*  CALCIUM 7.5* 7.7*   PT/INR No results for input(s): LABPROT, INR in the last 72 hours. ABG No results for input(s): PHART, HCO3 in the last 72 hours.  Invalid input(s): PCO2, PO2  Studies/Results: Dg Chest Port 1 View  06/02/2014   CLINICAL DATA:  81 year old male post chest tube removal. Subsequent encounter.  EXAM: PORTABLE CHEST - 1 VIEW  COMPARISON:  05/31/2014.  FINDINGS: Left-sided chest tube has been removed. No gross left-sided pneumothorax. Left-sided pleural effusion remains. Left base infiltrate or atelectasis may be present.  Cardiomegaly suspected. Pulmonary vascular congestion most notable centrally.  Calcified aorta.  Multiple slightly displaced left-sided rib fractures.  IMPRESSION: Left-sided chest tube removed without gross pneumothorax.  Left-sided pleural effusion. Underlying atelectasis/ infiltrate may  be present.  Pulmonary vascular congestion most notable centrally.  Multiple slightly displaced left-sided rib fractures.  Cardiomegaly.   Electronically Signed   By: Genia Del M.D.   On: 06/02/2014 08:15    Anti-infectives: Anti-infectives    Start     Dose/Rate Route Frequency Ordered Stop   05/31/14 1800  vancomycin (VANCOCIN) IVPB 750 mg/150 ml premix     750 mg 150 mL/hr over 60 Minutes Intravenous Every 24 hours 05/30/14 1552     05/30/14 1700  vancomycin (VANCOCIN) IVPB 1000 mg/200 mL premix     1,000 mg 200 mL/hr over 60 Minutes Intravenous  Once 05/30/14 1552 05/30/14 1808   05/30/14 1600  levofloxacin (LEVAQUIN) IVPB 750 mg  Status:  Discontinued     750 mg 100 mL/hr over 90 Minutes Intravenous Every 48 hours 05/30/14 1448 06/02/14 0843      Assessment/Plan: Fall Recurrent Left Hemopneumothorax/Lt rib fracture - CT out. Appreciate TCTS F/U. CXR in AM. Pulm toilet. Did 750cc on IS. CV  Afib with RVR  Improved on IV amiodarone - appreciate cards F/U ID - empiric ABX for PNA, CX ordered but still P. Change to Maxipime as Levophed can prolong QT with Amiodarone. MMP - home meds Leukocytosis- improved. likely secondary to left sided pneumonia seen on CT scan.   Acute on CKD-avoid nephrotoxins. CRT down some.  FEN - try mag citrate VTE - Lovenox DIspo - to tele bed   LOS: 5 days    Georganna Skeans, MD, MPH, FACS Trauma: [phone]  General Surgery: [phone]  06/02/2014
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
## 2014-06-03 ENCOUNTER — Inpatient Hospital Stay (HOSPITAL_COMMUNITY): Payer: Medicare Other

## 2014-06-03 ENCOUNTER — Encounter (HOSPITAL_COMMUNITY): Payer: Self-pay | Admitting: Physician Assistant

## 2014-06-03 LAB — BASIC METABOLIC PANEL
ANION GAP: 12 (ref 5–15)
BUN: 26 mg/dL — AB (ref 6–23)
CO2: 22 mmol/L (ref 19–32)
Calcium: 8.4 mg/dL (ref 8.4–10.5)
Chloride: 103 mmol/L (ref 96–112)
Creatinine, Ser: 1.6 mg/dL — ABNORMAL HIGH (ref 0.50–1.35)
GFR calc Af Amer: 45 mL/min — ABNORMAL LOW (ref >90)
GFR calc non Af Amer: 39 mL/min — ABNORMAL LOW (ref >90)
Glucose, Bld: 100 mg/dL — ABNORMAL HIGH (ref 70–99)
Potassium: 4.1 mmol/L (ref 3.5–5.1)
Sodium: 137 mmol/L (ref 135–145)

## 2014-06-03 MED ORDER — MAGNESIUM CITRATE PO SOLN
1.0000 | Freq: Once | ORAL | Status: AC
  Administered 2014-06-03: 1 via ORAL
  Filled 2014-06-03: qty 296

## 2014-06-03 MED ORDER — TRAMADOL HCL 50 MG PO TABS
50.0000 mg | ORAL_TABLET | Freq: Four times a day (QID) | ORAL | Status: DC | PRN
  Administered 2014-06-03 (×2): 100 mg via ORAL
  Filled 2014-06-03 (×2): qty 2

## 2014-06-03 MED ORDER — MORPHINE SULFATE 2 MG/ML IJ SOLN
2.0000 mg | INTRAMUSCULAR | Status: DC | PRN
Start: 2014-06-03 — End: 2014-06-04
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
## 2014-06-03 NOTE — Progress Notes (Signed)
ANTIBIOTIC CONSULT NOTE - FOLLOW UP  Pharmacy Consult:  Cefepime / Vancomycin Indication:  Rule out PNA  Allergies  Allergen Reactions  . Nickel     Via an allergy test  . Tylenol [Acetaminophen] Other (See Comments)    Unspecified - family MD advised pt to not take it    Patient Measurements: Height: 5\' 7"  (170.2 cm) Weight: 157 lb 13.6 oz (71.6 kg) IBW/kg (Calculated) : 66.1  Vital Signs: Temp: 98.4 F (36.9 C) (04/13 0518) Temp Source: Oral (04/13 0518) BP: 134/58 mmHg (04/13 0518) Pulse Rate: 74 (04/13 0518) Intake/Output from previous day: 04/12 0701 - 04/13 0700 In: 2642 [P.O.:570; I.V.:1872; IV Piggyback:200] Out: 860 [Urine:860]  Labs:  Recent Labs  06/01/14 0221  WBC 10.5  HGB 10.1*  PLT 289  CREATININE 2.00*   Estimated Creatinine Clearance: 27.1 mL/min (by C-G formula based on Cr of 2). No results for input(s): VANCOTROUGH, VANCOPEAK, VANCORANDOM, GENTTROUGH, GENTPEAK, GENTRANDOM, TOBRATROUGH, TOBRAPEAK, TOBRARND, AMIKACINPEAK, AMIKACINTROU, AMIKACIN in the last 72 hours.   Microbiology: Recent Results (from the past 720 hour(s))  MRSA PCR Screening     Status: None   Collection Time: 05/13/14  3:53 PM  Result Value Ref Range Status   MRSA by PCR NEGATIVE NEGATIVE Final    Comment:        The GeneXpert MRSA Assay (FDA approved for NASAL specimens only), is one component of a comprehensive MRSA colonization surveillance program. It is not intended to diagnose MRSA infection nor to guide or monitor treatment for MRSA infections.   MRSA PCR Screening     Status: None   Collection Time: 05/28/14  2:02 PM  Result Value Ref Range Status   MRSA by PCR NEGATIVE NEGATIVE Final    Comment:        The GeneXpert MRSA Assay (FDA approved for NASAL specimens only), is one component of a comprehensive MRSA colonization surveillance program. It is not intended to diagnose MRSA infection nor to guide or monitor treatment for MRSA infections.        Assessment: 83 YOM on vancomycin and cefepime for rule out HCAP.  Patient's renal function is starting to improve.  Vanc 4/9 >> Levaquin 4/9 >> 4/12 changed to cefepime due to QT 449 Cefepime 4/12 >>   Goal of Therapy:  Vancomycin trough level 15-20 mcg/ml   Plan:  - Change Cefepime to 1gm IV Q24H - Continue vanc 750mg  IV Q24H - Monitor renal fxn closely, clinical progress - Vanc trough and BMET this evening    Jeni Duling D. Mina Marble, PharmD, BCPS Pager:  [phone] 06/03/2014, 7:35 AM

## 2014-06-03 NOTE — Progress Notes (Signed)
Patient is on 1 liter oxygen and at rest saturation is 96%. Ambulating in room with oxygen on no change noted, maintaining 96%. Resting with no oxygen patient is 99%. Ambulating in [place] after 35 feet with no oxygen patient begins to drop saturation and must be cued to breathe through nose and not mouth. Saturation without oxygen while ambulation was 87-95%.

## 2014-06-03 NOTE — Progress Notes (Signed)
Patient ID: Brian Blankenship, male   DOB: 05-30-1930, 81 y.o.   MRN: [id number]   LOS: 6 days   Subjective: Doing well. Denies SOB, palpitations. Pain controlled.   Objective: Vital signs in last 24 hours: Temp:  [98.1 F (36.7 C)-99 F (37.2 C)] 98.4 F (36.9 C) (04/13 0518) Pulse Rate:  [74-85] 74 (04/13 0518) Resp:  [16-29] 16 (04/13 0518) BP: (134-148)/(50-65) 134/58 mmHg (04/13 0518) SpO2:  [96 %-100 %] 96 % (04/13 0518) Last BM Date: 05/25/14   IS: 1024ml (+245ml)   Radiology Results CXR: Left effusion look slightly worse (official read pending)   Physical Exam General appearance: alert and no distress Resp: rales LUL Cardio: regular rate and rhythm GI: normal findings: bowel sounds normal and soft, non-tender   Assessment/Plan: Fall Recurrent Left Hemopneumothorax/Lt rib fractures - CT out. Appreciate TCTS F/U. May benefit from pleurocentesis before discharge. Check resting/amb O2 sats on/off O2. Afib with RVR-- Doing well on PO amiodarone ID - empiric ABX for PNA, D5 of vanc and quinolone changed to Maxipime. Will d/c and see how he does. MMP - home meds Acute on CKD-avoid nephrotoxins.  FEN - SL IV, try mag citrate one more time, will order suppository tonight if that's ineffectual VTE - SCD's, Lovenox DIspo - Home once effusion is stable, cards clears    Lisette Abu, PA-C Pager: [phone] General Trauma PA Pager: [phone]  06/03/2014

## 2014-06-04 ENCOUNTER — Inpatient Hospital Stay (HOSPITAL_COMMUNITY): Payer: Medicare Other

## 2014-06-04 MED ORDER — AMIODARONE HCL 200 MG PO TABS: 200.0000 mg | ORAL_TABLET | Freq: Two times a day (BID) | ORAL | Status: DC

## 2014-06-04 NOTE — Progress Notes (Signed)
D/c instructions reviewed with pt and daughter, when daughter arrived. Copy of instructions given to pt, case manager Sharyn Lull to speak with daughter then pt will be d/c'd via wheelchair with belongings.

## 2014-06-04 NOTE — Discharge Instructions (Signed)
[facility] Stay Proper nutrition can help your body recover from illness and injury.   Foods and beverages high in protein, vitamins, and minerals help rebuild muscle loss, promote healing, & reduce fall risk.   In addition to eating healthy foods, a nutrition shake is an easy, delicious way to get the nutrition you need during and after your hospital stay  It is recommended that you continue to drink 2 bottles per day of:       Ensure Plus or Ensure Enlive for at least 1 month (30 days) after your hospital stay   Tips for adding a nutrition shake into your routine: As allowed, drink one with vitamins or medications instead of water or juice Enjoy one as a tasty mid-morning or afternoon snack Drink cold or make a milkshake out of it Drink one instead of milk with cereal or snacks Use as a coffee creamer   Available at the following grocery stores and pharmacies:           * [facility] [phone]            For COUPONS visit: www.ensure.com/join or http://dawson-may.com/   Suggested Substitutions Ensure Plus = Boost Plus = Carnation Breakfast Essentials = Boost Compact Ensure Active Clear = Boost Breeze Glucerna Shake = Boost Glucose Control = Carnation Breakfast Essentials SUGAR FREE       Daughter states pt is following up with Dr Jori Moll Davis--pt's urologist prior to admit, has appointment scheduled for Monday 4/18. Appointment set by daughter to see Dr Redge Gainer, Friday 06/05/14 Daughter will call Dr Sherryl Barters office and schedule an appointment
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
## 2014-06-04 NOTE — Progress Notes (Signed)
Patient ID: Brian Blankenship, male   DOB: Nov 19, 1930, 81 y.o.   MRN: [id number]   LOS: 7 days   Subjective: Doing well. Denies SOB, palpitations. Pain controlled.   Objective: Vital signs in last 24 hours: Temp:  [98.1 F (36.7 C)-98.5 F (36.9 C)] 98.4 F (36.9 C) (04/14 0604) Pulse Rate:  [80-86] 82 (04/14 0604) Resp:  [16-18] 16 (04/14 0604) BP: (127-163)/(50-85) 127/65 mmHg (04/14 0604) SpO2:  [92 %-98 %] 92 % (04/14 0604) Last BM Date: 06/03/14   IS: 1021ml (=)   Radiology Results CXR: [place] (official read pending)   Physical Exam General appearance: alert and no distress Resp: rales LUL Cardio: regular rate and rhythm GI: normal findings: bowel sounds normal and soft, non-tender   Assessment/Plan: Fall Recurrent Left Hemopneumothorax/Lt rib fractures - Appreciate TCTS F/U. Hypoxia is quite variable but don't think he'll need home O2 Afib with RVR-- Doing well on PO amiodarone MMP - home meds Acute on CKD- Improved  DIspo - Home today    Lisette Abu, PA-C Pager: [phone] General Trauma PA Pager: [phone]  06/04/2014

## 2014-06-04 NOTE — Progress Notes (Addendum)
Spoke with patient in room. Pt wishes to wait until his daughter arrives at 1400 to make final [facility] decisions, i.e. remaining with [facility] or changing to another company. I have confirmed that the address and phone numbers listed in EPIC are correct.  Sandi Mariscal, RN BSN MHA CCM  Case Manager, Trauma Service/Unit 14M [phone]  Addendum**[phone]--Spoke with pt's daughter in the room.  Explained that [facility] is always an option and that they have the ability to change if she wanted and she felt it best to remain with [facility].  I have called the resumption of care referral to the hospital rep.  Medicare IM (Important Message) delivered to patient today by me in anticipation of discharge.    Sandi Mariscal, RN BSN MHA CCM  Case Manager, Trauma Service/Unit 14M [phone]

## 2014-06-05 ENCOUNTER — Encounter: Payer: Self-pay | Admitting: Family Medicine

## 2014-06-05 ENCOUNTER — Ambulatory Visit (INDEPENDENT_AMBULATORY_CARE_PROVIDER_SITE_OTHER): Payer: Medicare Other | Admitting: Family Medicine

## 2014-06-05 DIAGNOSIS — M1A9XX0 Chronic gout, unspecified, without tophus (tophi): Secondary | ICD-10-CM

## 2014-06-05 DIAGNOSIS — S2243XD Multiple fractures of ribs, bilateral, subsequent encounter for fracture with routine healing: Secondary | ICD-10-CM | POA: Diagnosis not present

## 2014-06-05 DIAGNOSIS — E785 Hyperlipidemia, unspecified: Secondary | ICD-10-CM | POA: Diagnosis not present

## 2014-06-05 DIAGNOSIS — R7989 Other specified abnormal findings of blood chemistry: Secondary | ICD-10-CM

## 2014-06-05 DIAGNOSIS — J189 Pneumonia, unspecified organism: Secondary | ICD-10-CM

## 2014-06-05 DIAGNOSIS — K5790 Diverticulosis of intestine, part unspecified, without perforation or abscess without bleeding: Secondary | ICD-10-CM | POA: Diagnosis not present

## 2014-06-05 DIAGNOSIS — S2239XD Fracture of one rib, unspecified side, subsequent encounter for fracture with routine healing: Secondary | ICD-10-CM | POA: Diagnosis not present

## 2014-06-05 DIAGNOSIS — I2510 Atherosclerotic heart disease of native coronary artery without angina pectoris: Secondary | ICD-10-CM | POA: Diagnosis not present

## 2014-06-05 DIAGNOSIS — N400 Enlarged prostate without lower urinary tract symptoms: Secondary | ICD-10-CM | POA: Diagnosis not present

## 2014-06-05 DIAGNOSIS — L409 Psoriasis, unspecified: Secondary | ICD-10-CM | POA: Diagnosis not present

## 2014-06-05 DIAGNOSIS — R748 Abnormal levels of other serum enzymes: Secondary | ICD-10-CM | POA: Diagnosis not present

## 2014-06-05 DIAGNOSIS — I10 Essential (primary) hypertension: Secondary | ICD-10-CM | POA: Diagnosis not present

## 2014-06-05 LAB — POCT CBC
Granulocyte percent: 83.6 %G — AB (ref 37–80)
HEMATOCRIT: 33.6 % — AB (ref 43.5–53.7)
HEMOGLOBIN: 10.6 g/dL — AB (ref 14.1–18.1)
Lymph, poc: 1.2 (ref 0.6–3.4)
MCH: 29.1 pg (ref 27–31.2)
MCHC: 31.7 g/dL — AB (ref 31.8–35.4)
MCV: 91.7 fL (ref 80–97)
MPV: 6.8 fL (ref 0–99.8)
POC GRANULOCYTE: 8.9 — AB (ref 2–6.9)
POC LYMPH PERCENT: 11.6 %L (ref 10–50)
Platelet Count, POC: 337 10*3/uL (ref 142–424)
RBC: 3.67 M/uL — AB (ref 4.69–6.13)
RDW, POC: 13.2 %
WBC: 10.6 10*3/uL — AB (ref 4.6–10.2)
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
## 2014-06-05 NOTE — Patient Instructions (Addendum)
Continue with physical therapy Continue practicing taking good deep breaths Be careful and do not put yourself at risk for falling Use your walker regularly Drink plenty of fluids and use natural laxatives as much as possible Take as little pain medicine as possible or at least reduce it by one half instead of taking a whole one at a time Follow-up with cardiology and urology as planned For now, continue with allopurinol You can hold the Lipitor for a couple weeks and you should restart this.

## 2014-06-06 LAB — BMP8+EGFR
BUN / CREAT RATIO: 17 (ref 10–22)
BUN: 25 mg/dL (ref 8–27)
CO2: 25 mmol/L (ref 18–29)
Calcium: 8.2 mg/dL — ABNORMAL LOW (ref 8.6–10.2)
Chloride: 99 mmol/L (ref 97–108)
Creatinine, Ser: 1.44 mg/dL — ABNORMAL HIGH (ref 0.76–1.27)
GFR calc Af Amer: 52 mL/min/{1.73_m2} — ABNORMAL LOW (ref >59)
GFR calc non Af Amer: 45 mL/min/{1.73_m2} — ABNORMAL LOW (ref >59)
GLUCOSE: 100 mg/dL — AB (ref 65–99)
Potassium: 4.9 mmol/L (ref 3.5–5.2)
SODIUM: 137 mmol/L (ref 134–144)

## 2014-06-06 LAB — URIC ACID: URIC ACID: 4.5 mg/dL (ref 3.7–8.6)
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
## 2014-06-08 ENCOUNTER — Encounter: Payer: Self-pay | Admitting: Physician Assistant

## 2014-06-08 ENCOUNTER — Ambulatory Visit (INDEPENDENT_AMBULATORY_CARE_PROVIDER_SITE_OTHER): Payer: Medicare Other | Admitting: Physician Assistant

## 2014-06-08 VITALS — BP 152/70 | HR 82 | Ht 68.0 in | Wt 162.8 lb

## 2014-06-08 DIAGNOSIS — R609 Edema, unspecified: Secondary | ICD-10-CM

## 2014-06-08 DIAGNOSIS — I480 Paroxysmal atrial fibrillation: Secondary | ICD-10-CM

## 2014-06-08 DIAGNOSIS — Z09 Encounter for follow-up examination after completed treatment for conditions other than malignant neoplasm: Secondary | ICD-10-CM | POA: Diagnosis not present

## 2014-06-08 DIAGNOSIS — N2889 Other specified disorders of kidney and ureter: Secondary | ICD-10-CM | POA: Diagnosis not present

## 2014-06-08 DIAGNOSIS — I10 Essential (primary) hypertension: Secondary | ICD-10-CM | POA: Diagnosis not present

## 2014-06-08 DIAGNOSIS — N189 Chronic kidney disease, unspecified: Secondary | ICD-10-CM

## 2014-06-08 MED ORDER — FUROSEMIDE 20 MG PO TABS: ORAL_TABLET | ORAL | Status: DC

## 2014-06-08 MED ORDER — AMIODARONE HCL 200 MG PO TABS: 200.0000 mg | ORAL_TABLET | Freq: Every day | ORAL | Status: DC

## 2014-06-08 NOTE — Patient Instructions (Signed)
Medication Instructions:  1. DECREASE AMIODARONE TO 200 MG DAILY 2. START LASIX 20 MG 1 TABLET EVERY THIRD DAY FOR A TOTAL OF 3 DOSES ONLY THEN STOP  Labwork: BMET IN 1 WEEK  Testing/Procedures: NONE  Follow-Up: 07/09/14 @ 8:50 WITH SCOTT WEAVER, PAC SAME DAY DR. Aundra Dubin IS IN THE OFFICE  Any Other Special Instructions Will Be Listed Below (If Applicable).

## 2014-06-10 DIAGNOSIS — N400 Enlarged prostate without lower urinary tract symptoms: Secondary | ICD-10-CM | POA: Diagnosis not present

## 2014-06-10 DIAGNOSIS — K5790 Diverticulosis of intestine, part unspecified, without perforation or abscess without bleeding: Secondary | ICD-10-CM | POA: Diagnosis not present

## 2014-06-10 DIAGNOSIS — S2243XD Multiple fractures of ribs, bilateral, subsequent encounter for fracture with routine healing: Secondary | ICD-10-CM | POA: Diagnosis not present

## 2014-06-10 DIAGNOSIS — I10 Essential (primary) hypertension: Secondary | ICD-10-CM | POA: Diagnosis not present

## 2014-06-10 DIAGNOSIS — L409 Psoriasis, unspecified: Secondary | ICD-10-CM | POA: Diagnosis not present

## 2014-06-10 DIAGNOSIS — I2510 Atherosclerotic heart disease of native coronary artery without angina pectoris: Secondary | ICD-10-CM | POA: Diagnosis not present

## 2014-06-10 DIAGNOSIS — E785 Hyperlipidemia, unspecified: Secondary | ICD-10-CM | POA: Diagnosis not present

## 2014-06-12 DIAGNOSIS — S2243XD Multiple fractures of ribs, bilateral, subsequent encounter for fracture with routine healing: Secondary | ICD-10-CM | POA: Diagnosis not present

## 2014-06-12 DIAGNOSIS — E785 Hyperlipidemia, unspecified: Secondary | ICD-10-CM | POA: Diagnosis not present

## 2014-06-12 DIAGNOSIS — I2510 Atherosclerotic heart disease of native coronary artery without angina pectoris: Secondary | ICD-10-CM | POA: Diagnosis not present

## 2014-06-12 DIAGNOSIS — N400 Enlarged prostate without lower urinary tract symptoms: Secondary | ICD-10-CM | POA: Diagnosis not present

## 2014-06-12 DIAGNOSIS — I10 Essential (primary) hypertension: Secondary | ICD-10-CM | POA: Diagnosis not present

## 2014-06-12 DIAGNOSIS — L409 Psoriasis, unspecified: Secondary | ICD-10-CM | POA: Diagnosis not present

## 2014-06-12 DIAGNOSIS — K5790 Diverticulosis of intestine, part unspecified, without perforation or abscess without bleeding: Secondary | ICD-10-CM | POA: Diagnosis not present

## 2014-06-16 ENCOUNTER — Other Ambulatory Visit (INDEPENDENT_AMBULATORY_CARE_PROVIDER_SITE_OTHER): Payer: Medicare Other | Admitting: *Deleted

## 2014-06-16 DIAGNOSIS — I10 Essential (primary) hypertension: Secondary | ICD-10-CM | POA: Diagnosis not present

## 2014-06-16 DIAGNOSIS — E785 Hyperlipidemia, unspecified: Secondary | ICD-10-CM | POA: Diagnosis not present

## 2014-06-16 DIAGNOSIS — I2510 Atherosclerotic heart disease of native coronary artery without angina pectoris: Secondary | ICD-10-CM | POA: Diagnosis not present

## 2014-06-16 DIAGNOSIS — L409 Psoriasis, unspecified: Secondary | ICD-10-CM | POA: Diagnosis not present

## 2014-06-16 DIAGNOSIS — N400 Enlarged prostate without lower urinary tract symptoms: Secondary | ICD-10-CM | POA: Diagnosis not present

## 2014-06-16 DIAGNOSIS — S2243XD Multiple fractures of ribs, bilateral, subsequent encounter for fracture with routine healing: Secondary | ICD-10-CM | POA: Diagnosis not present

## 2014-06-16 DIAGNOSIS — K5790 Diverticulosis of intestine, part unspecified, without perforation or abscess without bleeding: Secondary | ICD-10-CM | POA: Diagnosis not present

## 2014-06-16 DIAGNOSIS — N189 Chronic kidney disease, unspecified: Secondary | ICD-10-CM | POA: Diagnosis not present

## 2014-06-16 LAB — BASIC METABOLIC PANEL
BUN: 14 mg/dL (ref 6–23)
CHLORIDE: 97 meq/L (ref 96–112)
CO2: 32 meq/L (ref 19–32)
CREATININE: 1.36 mg/dL (ref 0.40–1.50)
Calcium: 9.1 mg/dL (ref 8.4–10.5)
GFR: 53.37 mL/min — ABNORMAL LOW (ref >60.00)
GLUCOSE: 144 mg/dL — AB (ref 70–99)
POTASSIUM: 3.8 meq/L (ref 3.5–5.1)
Sodium: 134 mEq/L — ABNORMAL LOW (ref 135–145)

## 2014-06-16 NOTE — Addendum Note (Signed)
Addended by: Eulis Foster on: 06/16/2014 08:58 AM   Modules accepted: Orders

## 2014-06-17 ENCOUNTER — Other Ambulatory Visit: Payer: Self-pay | Admitting: Family Medicine

## 2014-06-18 DIAGNOSIS — N400 Enlarged prostate without lower urinary tract symptoms: Secondary | ICD-10-CM | POA: Diagnosis not present

## 2014-06-18 DIAGNOSIS — I2510 Atherosclerotic heart disease of native coronary artery without angina pectoris: Secondary | ICD-10-CM | POA: Diagnosis not present

## 2014-06-18 DIAGNOSIS — S2243XD Multiple fractures of ribs, bilateral, subsequent encounter for fracture with routine healing: Secondary | ICD-10-CM | POA: Diagnosis not present

## 2014-06-18 DIAGNOSIS — K5790 Diverticulosis of intestine, part unspecified, without perforation or abscess without bleeding: Secondary | ICD-10-CM | POA: Diagnosis not present

## 2014-06-18 DIAGNOSIS — L409 Psoriasis, unspecified: Secondary | ICD-10-CM | POA: Diagnosis not present

## 2014-06-18 DIAGNOSIS — I10 Essential (primary) hypertension: Secondary | ICD-10-CM | POA: Diagnosis not present

## 2014-06-18 DIAGNOSIS — E785 Hyperlipidemia, unspecified: Secondary | ICD-10-CM | POA: Diagnosis not present

## 2014-06-29 DIAGNOSIS — S37011D Minor contusion of right kidney, subsequent encounter: Secondary | ICD-10-CM | POA: Diagnosis not present

## 2014-06-29 DIAGNOSIS — N2889 Other specified disorders of kidney and ureter: Secondary | ICD-10-CM | POA: Diagnosis not present

## 2014-07-06 ENCOUNTER — Encounter: Payer: Self-pay | Admitting: Family Medicine

## 2014-07-06 ENCOUNTER — Ambulatory Visit (INDEPENDENT_AMBULATORY_CARE_PROVIDER_SITE_OTHER): Payer: Medicare Other | Admitting: Family Medicine

## 2014-07-06 ENCOUNTER — Ambulatory Visit (INDEPENDENT_AMBULATORY_CARE_PROVIDER_SITE_OTHER): Payer: Medicare Other

## 2014-07-06 VITALS — BP 136/64 | HR 72 | Temp 98.0°F | Ht 67.0 in | Wt 145.0 lb

## 2014-07-06 DIAGNOSIS — I480 Paroxysmal atrial fibrillation: Secondary | ICD-10-CM | POA: Diagnosis not present

## 2014-07-06 DIAGNOSIS — S37012D Minor contusion of left kidney, subsequent encounter: Secondary | ICD-10-CM | POA: Diagnosis not present

## 2014-07-06 DIAGNOSIS — I700 Atherosclerosis of aorta: Secondary | ICD-10-CM

## 2014-07-06 DIAGNOSIS — E559 Vitamin D deficiency, unspecified: Secondary | ICD-10-CM

## 2014-07-06 DIAGNOSIS — E785 Hyperlipidemia, unspecified: Secondary | ICD-10-CM | POA: Diagnosis not present

## 2014-07-06 DIAGNOSIS — J189 Pneumonia, unspecified organism: Secondary | ICD-10-CM | POA: Diagnosis not present

## 2014-07-06 DIAGNOSIS — I10 Essential (primary) hypertension: Secondary | ICD-10-CM

## 2014-07-06 DIAGNOSIS — S2239XD Fracture of one rib, unspecified side, subsequent encounter for fracture with routine healing: Secondary | ICD-10-CM | POA: Diagnosis not present
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
## 2014-07-06 NOTE — Patient Instructions (Signed)
Continue to drink plenty of fluids Increase your activity level but also to use good cleansing habits as they're still a lot of pneumonia in the community Follow-up with cardiology as planned Follow-up with urology as planned regarding the left renal contusion

## 2014-07-07 ENCOUNTER — Other Ambulatory Visit (INDEPENDENT_AMBULATORY_CARE_PROVIDER_SITE_OTHER): Payer: Medicare Other

## 2014-07-07 DIAGNOSIS — E785 Hyperlipidemia, unspecified: Secondary | ICD-10-CM

## 2014-07-07 DIAGNOSIS — I10 Essential (primary) hypertension: Secondary | ICD-10-CM | POA: Diagnosis not present

## 2014-07-07 DIAGNOSIS — J189 Pneumonia, unspecified organism: Secondary | ICD-10-CM

## 2014-07-07 DIAGNOSIS — E559 Vitamin D deficiency, unspecified: Secondary | ICD-10-CM | POA: Diagnosis not present

## 2014-07-07 DIAGNOSIS — I700 Atherosclerosis of aorta: Secondary | ICD-10-CM

## 2014-07-07 LAB — POCT CBC
Granulocyte percent: 58.5 %G (ref 37–80)
HEMATOCRIT: 40.5 % — AB (ref 43.5–53.7)
Hemoglobin: 13 g/dL — AB (ref 14.1–18.1)
Lymph, poc: 1.9 (ref 0.6–3.4)
MCH, POC: 29.4 pg (ref 27–31.2)
MCHC: 32 g/dL (ref 31.8–35.4)
MCV: 91.9 fL (ref 80–97)
MPV: 7.8 fL (ref 0–99.8)
PLATELET COUNT, POC: 229 10*3/uL (ref 142–424)
POC Granulocyte: 3.3 (ref 2–6.9)
POC LYMPH PERCENT: 34.7 %L (ref 10–50)
RBC: 4.41 M/uL — AB (ref 4.69–6.13)
RDW, POC: 13 %
WBC: 5.6 10*3/uL (ref 4.6–10.2)

## 2014-07-07 NOTE — Progress Notes (Signed)
Lab only 

## 2014-07-08 LAB — NMR, LIPOPROFILE
Cholesterol: 175 mg/dL (ref 100–199)
HDL Cholesterol by NMR: 53 mg/dL (ref >39)
HDL Particle Number: 26.7 umol/L — ABNORMAL LOW (ref >=30.5)
LDL Particle Number: 1295 nmol/L — ABNORMAL HIGH (ref <1000)
LDL Size: 21 nm (ref >20.5)
LDL-C: 105 mg/dL — ABNORMAL HIGH (ref 0–99)
SMALL LDL PARTICLE NUMBER: 561 nmol/L — AB (ref <=527)
Triglycerides by NMR: 85 mg/dL (ref 0–149)

## 2014-07-08 LAB — BMP8+EGFR
BUN / CREAT RATIO: 10 (ref 10–22)
BUN: 13 mg/dL (ref 8–27)
CHLORIDE: 102 mmol/L (ref 97–108)
CO2: 26 mmol/L (ref 18–29)
Calcium: 8.9 mg/dL (ref 8.6–10.2)
Creatinine, Ser: 1.3 mg/dL — ABNORMAL HIGH (ref 0.76–1.27)
GFR calc non Af Amer: 51 mL/min/{1.73_m2} — ABNORMAL LOW (ref >59)
GFR, EST AFRICAN AMERICAN: 59 mL/min/{1.73_m2} — AB (ref >59)
GLUCOSE: 91 mg/dL (ref 65–99)
Potassium: 4.3 mmol/L (ref 3.5–5.2)
Sodium: 141 mmol/L (ref 134–144)

## 2014-07-08 LAB — VITAMIN D 25 HYDROXY (VIT D DEFICIENCY, FRACTURES): Vit D, 25-Hydroxy: 33.9 ng/mL (ref 30.0–100.0)

## 2014-07-08 LAB — HEPATIC FUNCTION PANEL
ALK PHOS: 118 IU/L — AB (ref 39–117)
ALT: 10 IU/L (ref 0–44)
AST: 13 IU/L (ref 0–40)
Albumin: 3.4 g/dL — ABNORMAL LOW (ref 3.5–4.7)
BILIRUBIN TOTAL: 0.5 mg/dL (ref 0.0–1.2)
BILIRUBIN, DIRECT: 0.18 mg/dL (ref 0.00–0.40)
Total Protein: 6.4 g/dL (ref 6.0–8.5)
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
## 2014-07-09 ENCOUNTER — Ambulatory Visit (INDEPENDENT_AMBULATORY_CARE_PROVIDER_SITE_OTHER): Payer: Medicare Other | Admitting: Physician Assistant

## 2014-07-09 ENCOUNTER — Telehealth: Payer: Self-pay | Admitting: *Deleted

## 2014-07-09 ENCOUNTER — Encounter: Payer: Self-pay | Admitting: Physician Assistant

## 2014-07-09 VITALS — BP 132/52 | HR 74 | Ht 67.0 in | Wt 146.0 lb

## 2014-07-09 DIAGNOSIS — N189 Chronic kidney disease, unspecified: Secondary | ICD-10-CM

## 2014-07-09 DIAGNOSIS — I480 Paroxysmal atrial fibrillation: Secondary | ICD-10-CM | POA: Diagnosis not present

## 2014-07-09 DIAGNOSIS — I10 Essential (primary) hypertension: Secondary | ICD-10-CM | POA: Diagnosis not present

## 2014-07-09 NOTE — Patient Instructions (Signed)
Medication Instructions:  FINISH YOUR AMIODARONE; ONCE YOU FINISH YOUR CURRENT BOTTLE YOU WILL BE DONE  Labwork: NONE  Testing/Procedures: NONE  Follow-Up: Your physician wants you to follow-up in: 6 MONTHS WITH DR. Aundra Dubin You will receive a reminder letter in the mail two months in advance. If you don't receive a letter, please call our office to schedule the follow-up appointment.   Any Other Special Instructions Will Be Listed Below (If Applicable).

## 2014-07-09 NOTE — Telephone Encounter (Signed)
-----   Message from Chipper Herb, MD sent at 07/08/2014  7:35 AM EDT ----- The blood sugar is good at 91. The creatinine the most important kidney function test remains slightly elevated but is lower than it was 3 weeks ago and it is now 1.30 with 1.27 being the upper end of the normal range. The electrolytes including potassium are good. 1 liver function test, the albumin remains slightly decreased but is improved from 1 month ago and is almost within the normal range. One other liver function test is elevated by 1 point and this is not of concern. Cholesterol numbers with advanced lipid testing have a total LDL particle number that is now 1295. This number should be less than 1000. Previously this was 660 and that was 4 months ago. The LDL C is elevated at 105. The triglycerides are good at 85. The good cholesterol or the HDL particle number is low.--------- the patient should restart his cholesterol medicine as discussed and we will check his cholesterol numbers again at his next visit. He should also continue with improving his exercise regimen and continuing to watch his diet The vitamin D level is at the low end of the normal range.------ please make sure that the patient is taking vitamin D3, 1000, the now brand, 1 daily.

## 2014-07-21 ENCOUNTER — Encounter: Payer: Self-pay | Admitting: *Deleted

## 2014-07-23 ENCOUNTER — Other Ambulatory Visit: Payer: Self-pay | Admitting: Family Medicine

## 2014-07-27 DIAGNOSIS — R319 Hematuria, unspecified: Secondary | ICD-10-CM | POA: Diagnosis not present

## 2014-07-27 DIAGNOSIS — S37011D Minor contusion of right kidney, subsequent encounter: Secondary | ICD-10-CM | POA: Diagnosis not present

## 2014-08-17 ENCOUNTER — Other Ambulatory Visit: Payer: Self-pay

## 2014-09-01 ENCOUNTER — Ambulatory Visit: Payer: Medicare Other | Admitting: Family Medicine

## 2014-09-02 ENCOUNTER — Other Ambulatory Visit: Payer: Self-pay | Admitting: Family Medicine

## 2014-11-19 ENCOUNTER — Ambulatory Visit: Payer: Medicare Other | Admitting: Family Medicine

## 2014-11-23 ENCOUNTER — Ambulatory Visit (INDEPENDENT_AMBULATORY_CARE_PROVIDER_SITE_OTHER): Payer: Medicare Other | Admitting: Family Medicine

## 2014-11-23 ENCOUNTER — Encounter: Payer: Self-pay | Admitting: Family Medicine

## 2014-11-23 VITALS — BP 161/77 | HR 79 | Temp 97.5°F | Ht 67.0 in | Wt 156.0 lb

## 2014-11-23 DIAGNOSIS — R1310 Dysphagia, unspecified: Secondary | ICD-10-CM

## 2014-11-23 DIAGNOSIS — Z23 Encounter for immunization: Secondary | ICD-10-CM

## 2014-11-23 DIAGNOSIS — I10 Essential (primary) hypertension: Secondary | ICD-10-CM | POA: Diagnosis not present

## 2014-11-23 DIAGNOSIS — R059 Cough, unspecified: Secondary | ICD-10-CM

## 2014-11-23 DIAGNOSIS — E559 Vitamin D deficiency, unspecified: Secondary | ICD-10-CM | POA: Diagnosis not present

## 2014-11-23 DIAGNOSIS — R05 Cough: Secondary | ICD-10-CM | POA: Diagnosis not present

## 2014-11-23 DIAGNOSIS — I700 Atherosclerosis of aorta: Secondary | ICD-10-CM

## 2014-11-23 DIAGNOSIS — N183 Chronic kidney disease, stage 3 unspecified: Secondary | ICD-10-CM

## 2014-11-23 DIAGNOSIS — I480 Paroxysmal atrial fibrillation: Secondary | ICD-10-CM | POA: Diagnosis not present

## 2014-11-23 DIAGNOSIS — E785 Hyperlipidemia, unspecified: Secondary | ICD-10-CM | POA: Diagnosis not present

## 2014-11-23 MED ORDER — ATORVASTATIN CALCIUM 20 MG PO TABS: 20.0000 mg | ORAL_TABLET | Freq: Every day | ORAL | Status: DC

## 2014-11-23 NOTE — Patient Instructions (Addendum)
Medicare Annual Wellness Visit  [facility] and the medical providers at [facility] strive to bring you the best medical care.  In doing so we not only want to address your current medical conditions and concerns but also to detect new conditions early and prevent illness, disease and health-related problems.    Medicare offers a yearly Wellness Visit which allows our clinical staff to assess your need for preventative services including immunizations, lifestyle education, counseling to decrease risk of preventable diseases and screening for fall risk and other medical concerns.    This visit is provided free of charge (no copay) for all Medicare recipients. The clinical pharmacists at [facility] have begun to conduct these Wellness Visits which will also include a thorough review of all your medications.    As you primary medical provider recommend that you make an appointment for your Annual Wellness Visit if you have not done so already this year.  You may set up this appointment before you leave today or you may call back ([phone]) and schedule an appointment.  Please make sure when you call that you mention that you are scheduling your Annual Wellness Visit with the clinical pharmacist so that the appointment may be made for the proper length of time.     Continue current medications. Continue good therapeutic lifestyle changes which include good diet and exercise. Fall precautions discussed with patient. If an FOBT was given today- please return it to our front desk. If you are over 10 years old - you may need Prevnar 1 or the adult Pneumonia vaccine.  **Flu shots will be available soon--- please call and schedule a FLU-CLINIC appointment**  After your visit with [place] today you will receive a survey in the mail or online from [employer] regarding your care with [place]. Please take a moment to fill this out. Your feedback is  very important to [place] as you can help [place] better understand your patient needs as well as improve your experience and satisfaction. WE CARE ABOUT YOU!!!    Pick up ZANTAC 150 mg OTC You can get [employer] brand - equate for $4 - #65 pills ----this is equivalent to Zantac or ranitidine and this should be taken 1 twice daily before breakfast and supper. If the problems with swallowing continue beyond 4 weeks, get back in touch with [place] and we will arrange for you to see a gastroenterologist for further evaluation Please return the FOBT We will also arrange for you to see a cardiologist, hopefully one that saw you when you're in the hospital for follow-up of the paroxysmal atrial fibrillation. Continue to follow-up with urology and dermatology as planned This winter drink plenty of fluids and stay well hydrated and reduce the caffeine as much as possible
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
## 2014-11-24 LAB — BMP8+EGFR
BUN/Creatinine Ratio: 11 (ref 10–22)
BUN: 15 mg/dL (ref 8–27)
CO2: 25 mmol/L (ref 18–29)
CREATININE: 1.41 mg/dL — AB (ref 0.76–1.27)
Calcium: 8.9 mg/dL (ref 8.6–10.2)
Chloride: 103 mmol/L (ref 97–108)
GFR calc non Af Amer: 46 mL/min/{1.73_m2} — ABNORMAL LOW (ref >59)
GFR, EST AFRICAN AMERICAN: 53 mL/min/{1.73_m2} — AB (ref >59)
GLUCOSE: 86 mg/dL (ref 65–99)
Potassium: 4.3 mmol/L (ref 3.5–5.2)
Sodium: 142 mmol/L (ref 134–144)

## 2014-11-24 LAB — NMR, LIPOPROFILE
Cholesterol: 138 mg/dL (ref 100–199)
HDL CHOLESTEROL BY NMR: 64 mg/dL (ref >39)
HDL PARTICLE NUMBER: 41.9 umol/L (ref >=30.5)
LDL Particle Number: 904 nmol/L (ref <1000)
LDL Size: 20.5 nm (ref >20.5)
LDL-C: 63 mg/dL (ref 0–99)
LP-IR Score: <25 (ref <=45)
Small LDL Particle Number: 504 nmol/L (ref <=527)
Triglycerides by NMR: 53 mg/dL (ref 0–149)

## 2014-11-24 LAB — CBC WITH DIFFERENTIAL/PLATELET
Basophils Absolute: 0 10*3/uL (ref 0.0–0.2)
Basos: 1 %
EOS (ABSOLUTE): 0.2 10*3/uL (ref 0.0–0.4)
EOS: 4 %
HEMATOCRIT: 41.7 % (ref 37.5–51.0)
HEMOGLOBIN: 13.9 g/dL (ref 12.6–17.7)
Immature Grans (Abs): 0 10*3/uL (ref 0.0–0.1)
Immature Granulocytes: 0 %
LYMPHS ABS: 1.3 10*3/uL (ref 0.7–3.1)
Lymphs: 26 %
MCH: 30.7 pg (ref 26.6–33.0)
MCHC: 33.3 g/dL (ref 31.5–35.7)
MCV: 92 fL (ref 79–97)
MONOCYTES: 10 %
MONOS ABS: 0.5 10*3/uL (ref 0.1–0.9)
NEUTROS ABS: 2.9 10*3/uL (ref 1.4–7.0)
Neutrophils: 59 %
Platelets: 157 10*3/uL (ref 150–379)
RBC: 4.53 x10E6/uL (ref 4.14–5.80)
RDW: 15.1 % (ref 12.3–15.4)
WBC: 4.9 10*3/uL (ref 3.4–10.8)

## 2014-11-24 LAB — HEPATIC FUNCTION PANEL
ALBUMIN: 4.1 g/dL (ref 3.5–4.7)
ALK PHOS: 69 IU/L (ref 39–117)
ALT: 15 IU/L (ref 0–44)
AST: 19 IU/L (ref 0–40)
Bilirubin Total: 0.7 mg/dL (ref 0.0–1.2)
Bilirubin, Direct: 0.23 mg/dL (ref 0.00–0.40)
Total Protein: 6.5 g/dL (ref 6.0–8.5)

## 2014-11-24 LAB — VITAMIN D 25 HYDROXY (VIT D DEFICIENCY, FRACTURES): VIT D 25 HYDROXY: 37.2 ng/mL (ref 30.0–100.0)

## 2014-11-24 NOTE — Progress Notes (Signed)
Patient aware.

## 2014-11-30 DIAGNOSIS — N183 Chronic kidney disease, stage 3 unspecified: Secondary | ICD-10-CM

## 2014-11-30 DIAGNOSIS — N320 Bladder-neck obstruction: Secondary | ICD-10-CM | POA: Diagnosis not present

## 2014-11-30 DIAGNOSIS — S37011D Minor contusion of right kidney, subsequent encounter: Secondary | ICD-10-CM | POA: Diagnosis not present

## 2014-11-30 HISTORY — DX: Chronic kidney disease, stage 3 unspecified: N18.30
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
## 2014-12-10 ENCOUNTER — Encounter: Payer: Self-pay | Admitting: Physician Assistant

## 2014-12-10 ENCOUNTER — Ambulatory Visit (INDEPENDENT_AMBULATORY_CARE_PROVIDER_SITE_OTHER): Payer: Medicare Other | Admitting: Physician Assistant

## 2014-12-10 VITALS — BP 160/62 | HR 86 | Ht 68.0 in | Wt 155.8 lb

## 2014-12-10 DIAGNOSIS — I10 Essential (primary) hypertension: Secondary | ICD-10-CM

## 2014-12-10 DIAGNOSIS — I2510 Atherosclerotic heart disease of native coronary artery without angina pectoris: Secondary | ICD-10-CM

## 2014-12-10 DIAGNOSIS — I480 Paroxysmal atrial fibrillation: Secondary | ICD-10-CM | POA: Diagnosis not present

## 2014-12-10 DIAGNOSIS — N189 Chronic kidney disease, unspecified: Secondary | ICD-10-CM

## 2014-12-10 MED ORDER — ASPIRIN EC 81 MG PO TBEC: 81.0000 mg | DELAYED_RELEASE_TABLET | Freq: Every day | ORAL | Status: DC

## 2014-12-10 NOTE — Patient Instructions (Signed)
Medication Instructions:  1. START ASPIRIN 81 MG DAILY  Labwork: NONE  Testing/Procedures: NONE  Follow-Up: Your physician wants you to follow-up in: [place]. You will receive a reminder letter in the mail two months in advance. If you don't receive a letter, please call our office to schedule the follow-up appointment.   Any Other Special Instructions Will Be Listed Below (If Applicable).

## 2014-12-18 ENCOUNTER — Other Ambulatory Visit: Payer: Self-pay | Admitting: Family Medicine

## 2015-01-05 ENCOUNTER — Telehealth: Payer: Self-pay | Admitting: Family Medicine

## 2015-01-05 NOTE — Telephone Encounter (Signed)
SCHEDULED

## 2015-01-13 ENCOUNTER — Other Ambulatory Visit: Payer: Self-pay

## 2015-01-13 MED ORDER — CLOBETASOL PROPIONATE 0.05 % EX OINT: TOPICAL_OINTMENT | CUTANEOUS | Status: DC

## 2015-01-19 ENCOUNTER — Ambulatory Visit (INDEPENDENT_AMBULATORY_CARE_PROVIDER_SITE_OTHER): Payer: Medicare Other | Admitting: *Deleted

## 2015-01-19 VITALS — BP 133/68 | HR 79

## 2015-01-19 DIAGNOSIS — I10 Essential (primary) hypertension: Secondary | ICD-10-CM

## 2015-01-19 NOTE — Progress Notes (Signed)
Patient came in today for a bp recheck. Patients BP 133/68 mmHg  Pulse 79

## 2015-01-19 NOTE — Patient Instructions (Signed)
Hypertension Hypertension, commonly called high blood pressure, is when the force of blood pumping through your arteries is too strong. Your arteries are the blood vessels that carry blood from your heart throughout your body. A blood pressure reading consists of a higher number over a lower number, such as 110/72. The higher number (systolic) is the pressure inside your arteries when your heart pumps. The lower number (diastolic) is the pressure inside your arteries when your heart relaxes. Ideally you want your blood pressure below 120/80. Hypertension forces your heart to work harder to pump blood. Your arteries may become narrow or stiff. Having untreated or uncontrolled hypertension can cause heart attack, stroke, kidney disease, and other problems. RISK FACTORS Some risk factors for high blood pressure are controllable. Others are not.  Risk factors you cannot control include:   Race. You may be at higher risk if you are African American.  Age. Risk increases with age.  Gender. Men are at higher risk than women before age 45 years. After age 65, women are at higher risk than men. Risk factors you can control include:  Not getting enough exercise or physical activity.  Being overweight.  Getting too much fat, sugar, calories, or salt in your diet.  Drinking too much alcohol. SIGNS AND SYMPTOMS Hypertension does not usually cause signs or symptoms. Extremely high blood pressure (hypertensive crisis) may cause headache, anxiety, shortness of breath, and nosebleed. DIAGNOSIS To check if you have hypertension, your health care provider will measure your blood pressure while you are seated, with your arm held at the level of your heart. It should be measured at least twice using the same arm. Certain conditions can cause a difference in blood pressure between your right and left arms. A blood pressure reading that is higher than normal on one occasion does not mean that you need treatment. If  it is not clear whether you have high blood pressure, you may be asked to return on a different day to have your blood pressure checked again. Or, you may be asked to monitor your blood pressure at home for 1 or more weeks. TREATMENT Treating high blood pressure includes making lifestyle changes and possibly taking medicine. Living a healthy lifestyle can help lower high blood pressure. You may need to change some of your habits. Lifestyle changes may include:  Following the DASH diet. This diet is high in fruits, vegetables, and whole grains. It is low in salt, red meat, and added sugars.  Keep your sodium intake below 2,300 mg per day.  Getting at least 30-45 minutes of aerobic exercise at least 4 times per week.  Losing weight if necessary.  Not smoking.  Limiting alcoholic beverages.  Learning ways to reduce stress. Your health care provider may prescribe medicine if lifestyle changes are not enough to get your blood pressure under control, and if one of the following is true:  You are 18-59 years of age and your systolic blood pressure is above 140.  You are 60 years of age or older, and your systolic blood pressure is above 150.  Your diastolic blood pressure is above 90.  You have diabetes, and your systolic blood pressure is over 140 or your diastolic blood pressure is over 90.  You have kidney disease and your blood pressure is above 140/90.  You have heart disease and your blood pressure is above 140/90. Your personal target blood pressure may vary depending on your medical conditions, your age, and other factors. HOME CARE INSTRUCTIONS    Have your blood pressure rechecked as directed by your health care provider.   Take medicines only as directed by your health care provider. Follow the directions carefully. Blood pressure medicines must be taken as prescribed. The medicine does not work as well when you skip doses. Skipping doses also puts you at risk for  problems.  Do not smoke.   Monitor your blood pressure at home as directed by your health care provider. SEEK MEDICAL CARE IF:   You think you are having a reaction to medicines taken.  You have recurrent headaches or feel dizzy.  You have swelling in your ankles.  You have trouble with your vision. SEEK IMMEDIATE MEDICAL CARE IF:  You develop a severe headache or confusion.  You have unusual weakness, numbness, or feel faint.  You have severe chest or abdominal pain.  You vomit repeatedly.  You have trouble breathing. MAKE SURE YOU:   Understand these instructions.  Will watch your condition.  Will get help right away if you are not doing well or get worse.   This information is not intended to replace advice given to you by your health care provider. Make sure you discuss any questions you have with your health care provider.   Document Released: 02/06/2005 Document Revised: 06/23/2014 Document Reviewed: 11/29/2012 Elsevier Interactive Patient Education 2016 [employer].  

## 2015-01-24 DIAGNOSIS — Z79899 Other long term (current) drug therapy: Secondary | ICD-10-CM | POA: Diagnosis not present

## 2015-01-24 DIAGNOSIS — Z9079 Acquired absence of other genital organ(s): Secondary | ICD-10-CM | POA: Diagnosis not present

## 2015-01-24 DIAGNOSIS — R339 Retention of urine, unspecified: Secondary | ICD-10-CM | POA: Diagnosis not present

## 2015-01-24 DIAGNOSIS — I10 Essential (primary) hypertension: Secondary | ICD-10-CM | POA: Diagnosis not present

## 2015-01-24 DIAGNOSIS — R319 Hematuria, unspecified: Secondary | ICD-10-CM | POA: Diagnosis not present

## 2015-01-24 DIAGNOSIS — N139 Obstructive and reflux uropathy, unspecified: Secondary | ICD-10-CM | POA: Diagnosis not present

## 2015-01-24 DIAGNOSIS — Z87891 Personal history of nicotine dependence: Secondary | ICD-10-CM | POA: Diagnosis not present

## 2015-01-24 DIAGNOSIS — R9341 Abnormal radiologic findings on diagnostic imaging of renal pelvis, ureter, or bladder: Secondary | ICD-10-CM | POA: Diagnosis not present

## 2015-01-24 DIAGNOSIS — Z87430 Personal history of prostatic dysplasia: Secondary | ICD-10-CM | POA: Diagnosis not present

## 2015-01-26 DIAGNOSIS — R339 Retention of urine, unspecified: Secondary | ICD-10-CM | POA: Diagnosis not present

## 2015-01-26 DIAGNOSIS — R319 Hematuria, unspecified: Secondary | ICD-10-CM | POA: Diagnosis not present

## 2015-01-28 DIAGNOSIS — R310 Gross hematuria: Secondary | ICD-10-CM | POA: Diagnosis not present

## 2015-03-15 DIAGNOSIS — R319 Hematuria, unspecified: Secondary | ICD-10-CM | POA: Diagnosis not present

## 2015-03-15 DIAGNOSIS — N183 Chronic kidney disease, stage 3 (moderate): Secondary | ICD-10-CM | POA: Diagnosis not present

## 2015-03-30 DIAGNOSIS — H524 Presbyopia: Secondary | ICD-10-CM | POA: Diagnosis not present

## 2015-03-30 DIAGNOSIS — Z961 Presence of intraocular lens: Secondary | ICD-10-CM | POA: Diagnosis not present

## 2015-03-30 DIAGNOSIS — H26493 Other secondary cataract, bilateral: Secondary | ICD-10-CM | POA: Diagnosis not present

## 2015-05-06 ENCOUNTER — Other Ambulatory Visit: Payer: Self-pay | Admitting: *Deleted

## 2015-05-06 MED ORDER — VALSARTAN-HYDROCHLOROTHIAZIDE 320-25 MG PO TABS: 0.5000 | ORAL_TABLET | Freq: Every day | ORAL | Status: DC

## 2015-05-10 DIAGNOSIS — L409 Psoriasis, unspecified: Secondary | ICD-10-CM | POA: Diagnosis not present

## 2015-05-10 DIAGNOSIS — L820 Inflamed seborrheic keratosis: Secondary | ICD-10-CM | POA: Diagnosis not present

## 2015-05-27 ENCOUNTER — Encounter: Payer: Self-pay | Admitting: Family Medicine

## 2015-05-27 ENCOUNTER — Ambulatory Visit (INDEPENDENT_AMBULATORY_CARE_PROVIDER_SITE_OTHER): Payer: Medicare Other

## 2015-05-27 ENCOUNTER — Ambulatory Visit (INDEPENDENT_AMBULATORY_CARE_PROVIDER_SITE_OTHER): Payer: Medicare Other | Admitting: Family Medicine

## 2015-05-27 VITALS — BP 157/66 | HR 80 | Temp 97.6°F | Ht 68.0 in | Wt 159.0 lb

## 2015-05-27 DIAGNOSIS — I10 Essential (primary) hypertension: Secondary | ICD-10-CM

## 2015-05-27 DIAGNOSIS — N179 Acute kidney failure, unspecified: Secondary | ICD-10-CM

## 2015-05-27 DIAGNOSIS — Z1211 Encounter for screening for malignant neoplasm of colon: Secondary | ICD-10-CM

## 2015-05-27 DIAGNOSIS — I480 Paroxysmal atrial fibrillation: Secondary | ICD-10-CM

## 2015-05-27 DIAGNOSIS — I700 Atherosclerosis of aorta: Secondary | ICD-10-CM

## 2015-05-27 DIAGNOSIS — E559 Vitamin D deficiency, unspecified: Secondary | ICD-10-CM

## 2015-05-27 DIAGNOSIS — D696 Thrombocytopenia, unspecified: Secondary | ICD-10-CM | POA: Diagnosis not present

## 2015-05-27 DIAGNOSIS — N183 Chronic kidney disease, stage 3 unspecified: Secondary | ICD-10-CM

## 2015-05-27 DIAGNOSIS — D692 Other nonthrombocytopenic purpura: Secondary | ICD-10-CM

## 2015-05-27 DIAGNOSIS — R918 Other nonspecific abnormal finding of lung field: Secondary | ICD-10-CM

## 2015-05-27 DIAGNOSIS — N189 Chronic kidney disease, unspecified: Secondary | ICD-10-CM | POA: Diagnosis not present

## 2015-05-27 DIAGNOSIS — E785 Hyperlipidemia, unspecified: Secondary | ICD-10-CM

## 2015-05-27 MED ORDER — AMLODIPINE BESYLATE 2.5 MG PO TABS: 2.5000 mg | ORAL_TABLET | Freq: Every day | ORAL | Status: DC

## 2015-05-27 NOTE — Patient Instructions (Addendum)
Medicare Annual Wellness Visit  [facility] and the medical providers at [facility] strive to bring you the best medical care.  In doing so we not only want to address your current medical conditions and concerns but also to detect new conditions early and prevent illness, disease and health-related problems.    Medicare offers a yearly Wellness Visit which allows our clinical staff to assess your need for preventative services including immunizations, lifestyle education, counseling to decrease risk of preventable diseases and screening for fall risk and other medical concerns.    This visit is provided free of charge (no copay) for all Medicare recipients. The clinical pharmacists at [facility] have begun to conduct these Wellness Visits which will also include a thorough review of all your medications.    As you primary medical provider recommend that you make an appointment for your Annual Wellness Visit if you have not done so already this year.  You may set up this appointment before you leave today or you may call back [id number]) and schedule an appointment.  Please make sure when you call that you mention that you are scheduling your Annual Wellness Visit with the clinical pharmacist so that the appointment may be made for the proper length of time.     Continue current medications. Continue good therapeutic lifestyle changes which include good diet and exercise. Fall precautions discussed with patient. If an FOBT was given today- please return it to our front desk. If you are over 27 years old - you may need Prevnar 14 or the adult Pneumonia vaccine.  **Flu shots are available--- please call and schedule a FLU-CLINIC appointment**  After your visit with [place] today you will receive a survey in the mail or online from [employer] regarding your care with [place]. Please take a moment to fill this out. Your feedback is very  important to [place] as you can help [place] better understand your patient needs as well as improve your experience and satisfaction. WE CARE ABOUT YOU!!!   Continue to follow-up with cardiology, urology, and dermatology as recommended by those professionals. Return the FOBT We will call you with the results of the lab work as soon as it becomes available as well as the results of the chest x-ray. Bring blood pressure readings by for review in about 4 weeks after taking the amlodipine 2-1/2 mg daily Stay as active as possible and get plenty of exercise and drink plenty of fluids
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
## 2015-05-28 LAB — CBC WITH DIFFERENTIAL/PLATELET
BASOS: 1 %
Basophils Absolute: 0 10*3/uL (ref 0.0–0.2)
EOS (ABSOLUTE): 0.1 10*3/uL (ref 0.0–0.4)
EOS: 3 %
Hematocrit: 43.1 % (ref 37.5–51.0)
Hemoglobin: 14.5 g/dL (ref 12.6–17.7)
IMMATURE GRANS (ABS): 0 10*3/uL (ref 0.0–0.1)
IMMATURE GRANULOCYTES: 0 %
LYMPHS: 22 %
Lymphocytes Absolute: 1 10*3/uL (ref 0.7–3.1)
MCH: 31.7 pg (ref 26.6–33.0)
MCHC: 33.6 g/dL (ref 31.5–35.7)
MCV: 94 fL (ref 79–97)
Monocytes Absolute: 0.5 10*3/uL (ref 0.1–0.9)
Monocytes: 10 %
NEUTROS PCT: 64 %
Neutrophils Absolute: 3 10*3/uL (ref 1.4–7.0)
PLATELETS: 164 10*3/uL (ref 150–379)
RBC: 4.57 x10E6/uL (ref 4.14–5.80)
RDW: 14.3 % (ref 12.3–15.4)
WBC: 4.6 10*3/uL (ref 3.4–10.8)

## 2015-05-28 LAB — HEPATIC FUNCTION PANEL
ALT: 18 IU/L (ref 0–44)
AST: 19 IU/L (ref 0–40)
Albumin: 4.1 g/dL (ref 3.5–4.7)
Alkaline Phosphatase: 65 IU/L (ref 39–117)
BILIRUBIN, DIRECT: 0.2 mg/dL (ref 0.00–0.40)
Bilirubin Total: 0.7 mg/dL (ref 0.0–1.2)
TOTAL PROTEIN: 6.2 g/dL (ref 6.0–8.5)

## 2015-05-28 LAB — BMP8+EGFR
BUN/Creatinine Ratio: 13 (ref 10–24)
BUN: 17 mg/dL (ref 8–27)
CALCIUM: 9.1 mg/dL (ref 8.6–10.2)
CO2: 27 mmol/L (ref 18–29)
CREATININE: 1.31 mg/dL — AB (ref 0.76–1.27)
Chloride: 104 mmol/L (ref 96–106)
GFR calc Af Amer: 58 mL/min/{1.73_m2} — ABNORMAL LOW (ref >59)
GFR, EST NON AFRICAN AMERICAN: 50 mL/min/{1.73_m2} — AB (ref >59)
Glucose: 96 mg/dL (ref 65–99)
POTASSIUM: 4.4 mmol/L (ref 3.5–5.2)
Sodium: 142 mmol/L (ref 134–144)

## 2015-05-28 LAB — NMR, LIPOPROFILE
Cholesterol: 143 mg/dL (ref 100–199)
HDL CHOLESTEROL BY NMR: 62 mg/dL (ref >39)
HDL Particle Number: 41.4 umol/L (ref >=30.5)
LDL PARTICLE NUMBER: 777 nmol/L (ref <1000)
LDL SIZE: 20.7 nm (ref >20.5)
LDL-C: 67 mg/dL (ref 0–99)
LP-IR Score: 44 (ref <=45)
Small LDL Particle Number: 481 nmol/L (ref <=527)
TRIGLYCERIDES BY NMR: 71 mg/dL (ref 0–149)

## 2015-05-28 LAB — VITAMIN D 25 HYDROXY (VIT D DEFICIENCY, FRACTURES): Vit D, 25-Hydroxy: 34.2 ng/mL (ref 30.0–100.0)

## 2015-05-31 ENCOUNTER — Other Ambulatory Visit: Payer: Medicare Other

## 2015-05-31 DIAGNOSIS — Z1211 Encounter for screening for malignant neoplasm of colon: Secondary | ICD-10-CM

## 2015-06-04 LAB — FECAL OCCULT BLOOD, IMMUNOCHEMICAL: Fecal Occult Bld: NEGATIVE

## 2015-06-07 ENCOUNTER — Encounter: Payer: Self-pay | Admitting: Family

## 2015-06-07 ENCOUNTER — Ambulatory Visit: Payer: Medicare Other | Admitting: Family

## 2015-06-07 ENCOUNTER — Ambulatory Visit (INDEPENDENT_AMBULATORY_CARE_PROVIDER_SITE_OTHER): Payer: Medicare Other | Admitting: Family

## 2015-06-07 VITALS — BP 150/69 | HR 76 | Temp 97.4°F | Ht 68.0 in | Wt 160.0 lb

## 2015-06-07 DIAGNOSIS — I10 Essential (primary) hypertension: Secondary | ICD-10-CM

## 2015-06-07 DIAGNOSIS — H1131 Conjunctival hemorrhage, right eye: Secondary | ICD-10-CM

## 2015-06-07 NOTE — Patient Instructions (Signed)
Subconjunctival Hemorrhage °Subconjunctival hemorrhage is bleeding that happens between the white part of your eye (sclera) and the clear membrane that covers the outside of your eye (conjunctiva). There are many tiny blood vessels near the surface of your eye. A subconjunctival hemorrhage happens when one or more of these vessels breaks and bleeds, causing a red patch to appear on your eye. This is similar to a bruise. °Depending on the amount of bleeding, the red patch may only cover a small area of your eye or it may cover the entire visible part of the sclera. If a lot of blood collects under the conjunctiva, there may also be swelling. Subconjunctival hemorrhages do not affect your vision or cause pain, but your eye may feel irritated if there is swelling. Subconjunctival hemorrhages usually do not require treatment, and they disappear on their own within two weeks. °CAUSES °This condition may be caused by: °· Mild trauma, such as rubbing your eye too hard. °· Severe trauma or blunt injuries. °· Coughing, sneezing, or vomiting. °· Straining, such as when lifting a heavy object. °· High blood pressure. °· Recent eye surgery. °· A history of diabetes. °· Certain medicines, especially blood thinners (anticoagulants). °· Other conditions, such as eye tumors, bleeding disorders, or blood vessel abnormalities. °Subconjunctival hemorrhages can happen without an obvious cause.  °SYMPTOMS  °Symptoms of this condition include: °· A bright red or dark red patch on the white part of the eye. °¨ The red area may spread out to cover a larger area of the eye before it goes away. °¨ The red area may turn brownish-yellow before it goes away. °· Swelling. °· Mild eye irritation. °DIAGNOSIS °This condition is diagnosed with a physical exam. If your subconjunctival hemorrhage was caused by trauma, your health care provider may refer you to an eye specialist (ophthalmologist) or another specialist to check for other injuries. You  may have other tests, including: °· An eye exam. °· A blood pressure check. °· Blood tests to check for bleeding disorders. °If your subconjunctival hemorrhage was caused by trauma, X-rays or a CT scan may be done to check for other injuries. °TREATMENT °Usually, no treatment is needed. Your health care provider may recommend eye drops or cold compresses to help with discomfort. °HOME CARE INSTRUCTIONS °· Take over-the-counter and prescription medicines only as directed by your health care provider. °· Use eye drops or cold compresses to help with discomfort as directed by your health care provider. °· Avoid activities, things, and environments that may irritate or injure your eye. °· Keep all follow-up visits as told by your health care provider. This is important. °SEEK MEDICAL CARE IF: °· You have pain in your eye. °· The bleeding does not go away within 3 weeks. °· You keep getting new subconjunctival hemorrhages. °SEEK IMMEDIATE MEDICAL CARE IF: °· Your vision changes or you have difficulty seeing. °· You suddenly develop severe sensitivity to light. °· You develop a severe headache, persistent vomiting, confusion, or abnormal tiredness (lethargy). °· Your eye seems to bulge or protrude from your eye socket. °· You develop unexplained bruises on your body. °· You have unexplained bleeding in another area of your body. °  °This information is not intended to replace advice given to you by your health care provider. Make sure you discuss any questions you have with your health care provider. °  °Document Released: 02/06/2005 Document Revised: 10/28/2014 Document Reviewed: 04/15/2014 °Elsevier Interactive Patient Education ©2016 [employer] ° °

## 2015-06-07 NOTE — Progress Notes (Signed)
   Subjective:    Patient ID: Brian Blankenship, male    DOB: 07/19/1930, 82 y.o.   MRN: [id number]  HPI PT presents to the office today with redness to right eye. PT states he noticed it Friday and denies any trauma to the area. Pt denies pain, drainage, or itching of his eye. Pt has tried OTC eye drops with mild relief. Pt states he was started on norvasc 2.5 mg on 05/28/15 and thought this might be causing his redness so has not taken the Norvasc yesterday or today. Pt's BP is elevated today.    Review of Systems  Constitutional: Negative.   HENT: Negative.   Respiratory: Negative.   Cardiovascular: Negative.   Gastrointestinal: Negative.   Endocrine: Negative.   Genitourinary: Negative.   Musculoskeletal: Negative.   Neurological: Negative.   Hematological: Negative.   Psychiatric/Behavioral: Negative.   All other systems reviewed and are negative.      Objective:   Physical Exam  Constitutional: He is oriented to person, place, and time. He appears well-developed and well-nourished. No distress.  HENT:  Head: Normocephalic.  Eyes: Pupils are equal, round, and reactive to light. Right eye exhibits no discharge. Left eye exhibits no discharge. Right conjunctiva has a hemorrhage.    Neck: Normal range of motion. Neck supple. No thyromegaly present.  Cardiovascular: Normal rate, regular rhythm, normal heart sounds and intact distal pulses.   No murmur heard. Pulmonary/Chest: Effort normal and breath sounds normal. No respiratory distress. He has no wheezes.  Abdominal: Soft. Bowel sounds are normal. He exhibits no distension. There is no tenderness.  Musculoskeletal: Normal range of motion. He exhibits no edema or tenderness.  Neurological: He is alert and oriented to person, place, and time. He has normal reflexes. No cranial nerve deficit.  Skin: Skin is warm and dry. No rash noted. No erythema.  Psychiatric: He has a normal mood and affect. His behavior is normal. Judgment and  thought content normal.  Vitals reviewed.     BP 150/69 mmHg  Pulse 76  Temp(Src) 97.4 F (36.3 C) (Oral)  Ht 5\' 8"  (1.727 m)  Wt 160 lb (72.576 kg)  BMI 24.33 kg/m2     Assessment & Plan:  1. Essential hypertension -PT to restart his Norvasc 2.5mg  -Dash diet information given -Exercise encouraged - Stress Management  -Continue current meds -Keep appts with Dr. Laurance Flatten  2. Subconjunctival hemorrhage of right eye -Do not rub  -Use cold compresses as needed -Avoid irritating eye -RTO Prn   Evelina Dun, FNP

## 2015-06-29 ENCOUNTER — Other Ambulatory Visit: Payer: Self-pay | Admitting: Family Medicine

## 2015-09-17 DIAGNOSIS — N183 Chronic kidney disease, stage 3 (moderate): Secondary | ICD-10-CM | POA: Diagnosis not present

## 2015-09-17 DIAGNOSIS — N401 Enlarged prostate with lower urinary tract symptoms: Secondary | ICD-10-CM | POA: Diagnosis not present

## 2015-10-20 ENCOUNTER — Other Ambulatory Visit: Payer: Self-pay | Admitting: Family Medicine

## 2015-11-24 ENCOUNTER — Other Ambulatory Visit: Payer: Self-pay | Admitting: Family Medicine

## 2015-11-25 ENCOUNTER — Encounter: Payer: Self-pay | Admitting: Family Medicine

## 2015-11-25 ENCOUNTER — Ambulatory Visit (INDEPENDENT_AMBULATORY_CARE_PROVIDER_SITE_OTHER): Payer: Medicare Other | Admitting: Family Medicine

## 2015-11-25 VITALS — BP 142/68 | HR 74 | Temp 97.2°F | Ht 68.0 in | Wt 161.0 lb

## 2015-11-25 DIAGNOSIS — Z23 Encounter for immunization: Secondary | ICD-10-CM

## 2015-11-25 DIAGNOSIS — E559 Vitamin D deficiency, unspecified: Secondary | ICD-10-CM

## 2015-11-25 DIAGNOSIS — N183 Chronic kidney disease, stage 3 unspecified: Secondary | ICD-10-CM

## 2015-11-25 DIAGNOSIS — I700 Atherosclerosis of aorta: Secondary | ICD-10-CM

## 2015-11-25 DIAGNOSIS — E785 Hyperlipidemia, unspecified: Secondary | ICD-10-CM

## 2015-11-25 DIAGNOSIS — I10 Essential (primary) hypertension: Secondary | ICD-10-CM | POA: Diagnosis not present

## 2015-11-25 DIAGNOSIS — I480 Paroxysmal atrial fibrillation: Secondary | ICD-10-CM | POA: Diagnosis not present

## 2015-11-25 DIAGNOSIS — Z0000 Encounter for general adult medical examination without abnormal findings: Secondary | ICD-10-CM

## 2015-11-25 DIAGNOSIS — D696 Thrombocytopenia, unspecified: Secondary | ICD-10-CM | POA: Diagnosis not present

## 2015-11-25 NOTE — Patient Instructions (Addendum)
Medicare Annual Wellness Visit  [facility] and the medical providers at [facility] strive to bring you the best medical care.  In doing so we not only want to address your current medical conditions and concerns but also to detect new conditions early and prevent illness, disease and health-related problems.    Medicare offers a yearly Wellness Visit which allows our clinical staff to assess your need for preventative services including immunizations, lifestyle education, counseling to decrease risk of preventable diseases and screening for fall risk and other medical concerns.    This visit is provided free of charge (no copay) for all Medicare recipients. The clinical pharmacists at [facility] have begun to conduct these Wellness Visits which will also include a thorough review of all your medications.    As you primary medical provider recommend that you make an appointment for your Annual Wellness Visit if you have not done so already this year.  You may set up this appointment before you leave today or you may call back [phone] and schedule an appointment.  Please make sure when you call that you mention that you are scheduling your Annual Wellness Visit with the clinical pharmacist so that the appointment may be made for the proper length of time.    Continue current medications. Continue good therapeutic lifestyle changes which include good diet and exercise. Fall precautions discussed with patient. If an FOBT was given today- please return it to our front desk. If you are over 83 years old - you may need Prevnar 24 or the adult Pneumonia vaccine.  **Flu shots are available--- please call and schedule a FLU-CLINIC appointment**  After your visit with [place] today you will receive a survey in the mail or online from [employer] regarding your care with [place]. Please take a moment to fill this out. Your feedback is very  important to [place] as you can help [place] better understand your patient needs as well as improve your experience and satisfaction. WE CARE ABOUT YOU!!!   Continue to walk and exercise as much as possible We will arrange for you to have an appointment with the cardiologist that comes here as your last appointment with cardiology for the paroxysmal atrial fibrillation was about a year ago. His name is Dr. Percival Spanish The flu shot that you received today may make your arm sore Restart the ranitidine 150 mg twice daily before breakfast and supper to see if this helps the belching Take the baby aspirin after breakfast every day instead of on an empty stomach weight at night Continue to monitor blood pressures and bring readings to the office Follow-up with their urologist as planned Continue to get eye exams regularly

## 2015-11-25 NOTE — Addendum Note (Signed)
Addended by: Zannie Cove on: 11/25/2015 10:09 AM   Modules accepted: Orders

## 2015-11-25 NOTE — Addendum Note (Signed)
Addended by: Zannie Cove on: 11/25/2015 09:44 AM   Modules accepted: Orders
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
## 2015-11-26 ENCOUNTER — Telehealth: Payer: Self-pay | Admitting: Family Medicine

## 2015-11-26 LAB — CBC WITH DIFFERENTIAL/PLATELET
BASOS ABS: 0.1 10*3/uL (ref 0.0–0.2)
BASOS: 1 %
EOS (ABSOLUTE): 0.1 10*3/uL (ref 0.0–0.4)
Eos: 2 %
Hematocrit: 43.1 % (ref 37.5–51.0)
Hemoglobin: 14.7 g/dL (ref 12.6–17.7)
IMMATURE GRANS (ABS): 0 10*3/uL (ref 0.0–0.1)
Immature Granulocytes: 0 %
LYMPHS ABS: 1 10*3/uL (ref 0.7–3.1)
LYMPHS: 20 %
MCH: 32.5 pg (ref 26.6–33.0)
MCHC: 34.1 g/dL (ref 31.5–35.7)
MCV: 95 fL (ref 79–97)
Monocytes Absolute: 0.5 10*3/uL (ref 0.1–0.9)
Monocytes: 11 %
NEUTROS ABS: 3.3 10*3/uL (ref 1.4–7.0)
Neutrophils: 66 %
PLATELETS: 170 10*3/uL (ref 150–379)
RBC: 4.52 x10E6/uL (ref 4.14–5.80)
RDW: 13.3 % (ref 12.3–15.4)
WBC: 5 10*3/uL (ref 3.4–10.8)

## 2015-11-26 LAB — NMR, LIPOPROFILE
CHOLESTEROL: 139 mg/dL (ref 100–199)
HDL CHOLESTEROL BY NMR: 68 mg/dL (ref >39)
HDL Particle Number: 43.9 umol/L (ref >=30.5)
LDL PARTICLE NUMBER: 890 nmol/L (ref <1000)
LDL Size: 20.4 nm (ref >20.5)
LDL-C: 61 mg/dL (ref 0–99)
LP-IR Score: 27 (ref <=45)
Small LDL Particle Number: 508 nmol/L (ref <=527)
TRIGLYCERIDES BY NMR: 52 mg/dL (ref 0–149)

## 2015-11-26 LAB — HEPATIC FUNCTION PANEL
ALBUMIN: 4.4 g/dL (ref 3.5–4.7)
ALK PHOS: 70 IU/L (ref 39–117)
ALT: 19 IU/L (ref 0–44)
AST: 25 IU/L (ref 0–40)
Bilirubin Total: 0.7 mg/dL (ref 0.0–1.2)
Bilirubin, Direct: 0.23 mg/dL (ref 0.00–0.40)
TOTAL PROTEIN: 6.7 g/dL (ref 6.0–8.5)

## 2015-11-26 LAB — BMP8+EGFR
BUN / CREAT RATIO: 12 (ref 10–24)
BUN: 16 mg/dL (ref 8–27)
CALCIUM: 9.3 mg/dL (ref 8.6–10.2)
CHLORIDE: 102 mmol/L (ref 96–106)
CO2: 28 mmol/L (ref 18–29)
Creatinine, Ser: 1.36 mg/dL — ABNORMAL HIGH (ref 0.76–1.27)
GFR, EST AFRICAN AMERICAN: 55 mL/min/{1.73_m2} — AB (ref >59)
GFR, EST NON AFRICAN AMERICAN: 48 mL/min/{1.73_m2} — AB (ref >59)
Glucose: 93 mg/dL (ref 65–99)
POTASSIUM: 4.6 mmol/L (ref 3.5–5.2)
SODIUM: 144 mmol/L (ref 134–144)

## 2015-11-26 LAB — URIC ACID: Uric Acid: 6.2 mg/dL (ref 3.7–8.6)

## 2015-11-26 LAB — VITAMIN D 25 HYDROXY (VIT D DEFICIENCY, FRACTURES): Vit D, 25-Hydroxy: 41.4 ng/mL (ref 30.0–100.0)

## 2015-11-26 NOTE — Telephone Encounter (Signed)
Patient aware of results.

## 2015-12-01 DIAGNOSIS — N183 Chronic kidney disease, stage 3 (moderate): Secondary | ICD-10-CM | POA: Diagnosis not present

## 2015-12-01 DIAGNOSIS — R319 Hematuria, unspecified: Secondary | ICD-10-CM | POA: Diagnosis not present

## 2015-12-01 DIAGNOSIS — E119 Type 2 diabetes mellitus without complications: Secondary | ICD-10-CM | POA: Diagnosis not present

## 2015-12-01 DIAGNOSIS — Z87891 Personal history of nicotine dependence: Secondary | ICD-10-CM | POA: Diagnosis not present

## 2015-12-01 DIAGNOSIS — Z87442 Personal history of urinary calculi: Secondary | ICD-10-CM | POA: Diagnosis not present

## 2015-12-02 ENCOUNTER — Telehealth: Payer: Self-pay | Admitting: Family Medicine

## 2015-12-02 DIAGNOSIS — R319 Hematuria, unspecified: Secondary | ICD-10-CM | POA: Diagnosis not present

## 2015-12-02 NOTE — Telephone Encounter (Signed)
Aware of need to follow up with Dr. Laurance Flatten.

## 2015-12-03 ENCOUNTER — Telehealth: Payer: Self-pay | Admitting: Family Medicine

## 2015-12-03 NOTE — Telephone Encounter (Signed)
Do we need to put a new referral in?

## 2015-12-06 DIAGNOSIS — N183 Chronic kidney disease, stage 3 (moderate): Secondary | ICD-10-CM | POA: Diagnosis not present

## 2015-12-06 DIAGNOSIS — R3912 Poor urinary stream: Secondary | ICD-10-CM | POA: Diagnosis not present

## 2015-12-06 DIAGNOSIS — N401 Enlarged prostate with lower urinary tract symptoms: Secondary | ICD-10-CM | POA: Diagnosis not present

## 2015-12-06 DIAGNOSIS — R828 Abnormal findings on cytological and histological examination of urine: Secondary | ICD-10-CM | POA: Diagnosis not present

## 2015-12-06 DIAGNOSIS — N3001 Acute cystitis with hematuria: Secondary | ICD-10-CM | POA: Diagnosis not present

## 2015-12-06 DIAGNOSIS — R319 Hematuria, unspecified: Secondary | ICD-10-CM | POA: Diagnosis not present

## 2015-12-08 ENCOUNTER — Ambulatory Visit: Payer: Medicare Other | Admitting: Family Medicine

## 2015-12-10 NOTE — Telephone Encounter (Signed)
Referral changed to [facility]

## 2015-12-13 ENCOUNTER — Telehealth: Payer: Self-pay | Admitting: Family Medicine

## 2015-12-13 NOTE — Telephone Encounter (Signed)
Pt would like to speak with Roselyn Reef about his appt with Dr. Telford Nab in [place].

## 2015-12-14 NOTE — Telephone Encounter (Signed)
spoke with pt - there is a new referral for the [place] office - [facility]. We will arrange this  - referrals can you work on this?

## 2015-12-15 NOTE — Telephone Encounter (Signed)
Pt has been seen by Dr. Aundra Dubin in the past. He has to be released from him before this appointment can be scheduled with Dr. Percival Spanish. Message sent requesting release

## 2015-12-24 DIAGNOSIS — N401 Enlarged prostate with lower urinary tract symptoms: Secondary | ICD-10-CM | POA: Diagnosis not present

## 2015-12-24 DIAGNOSIS — N183 Chronic kidney disease, stage 3 (moderate): Secondary | ICD-10-CM | POA: Diagnosis not present

## 2015-12-24 DIAGNOSIS — N138 Other obstructive and reflux uropathy: Secondary | ICD-10-CM | POA: Diagnosis not present

## 2015-12-29 NOTE — Telephone Encounter (Signed)
Pt scheduled fro 02/02/16 in [place] office with [facility]

## 2016-01-03 IMAGING — CT CT CHEST W/O CM
2 of 3 series · 12 of 36 positions shown, 15 images · non-contrast
Comparison: Radiograph stress that CT 05/13/2014, radiograph
05/29/2014

CLINICAL DATA: Hemothorax on the left.  Chest tube in place.

EXAM:
CT CHEST WITHOUT CONTRAST
TECHNIQUE: Multidetector CT imaging of the chest was performed following the
standard protocol without IV contrast..

[Series 201: chest without, idose (2) · axial · non-contrast · 0.80mm/px · z∈[+86,+376]mm · 9 of 68 slices shown, 12 images]
[im 5/68  mediastinal]
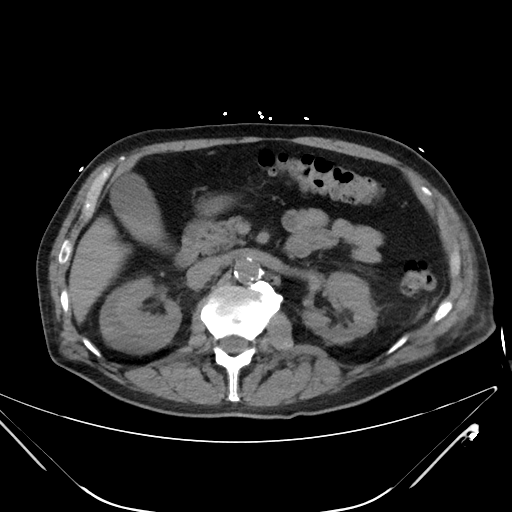
[im 5/68  lung]
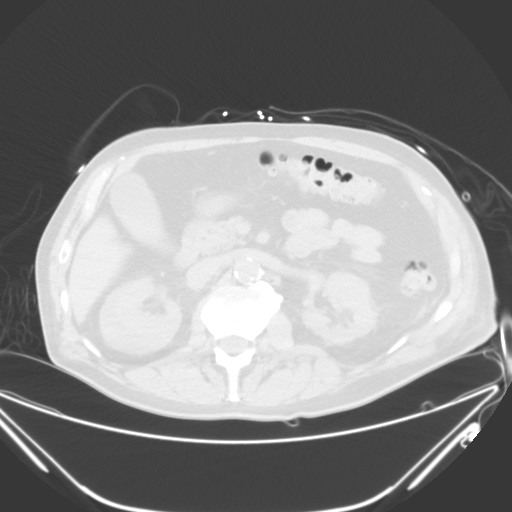
[im 13/68  lung]
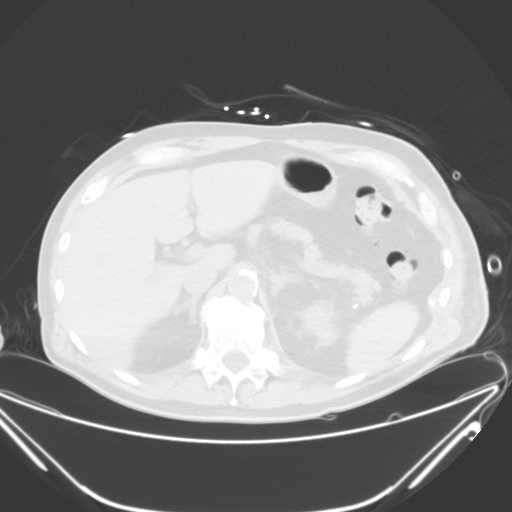
[im 20/68  lung]
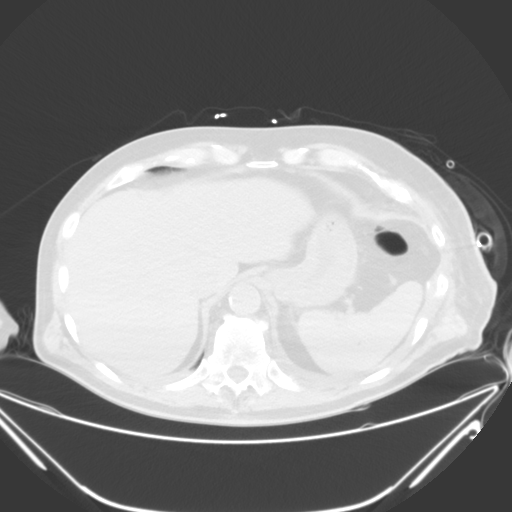
[im 28/68  lung]
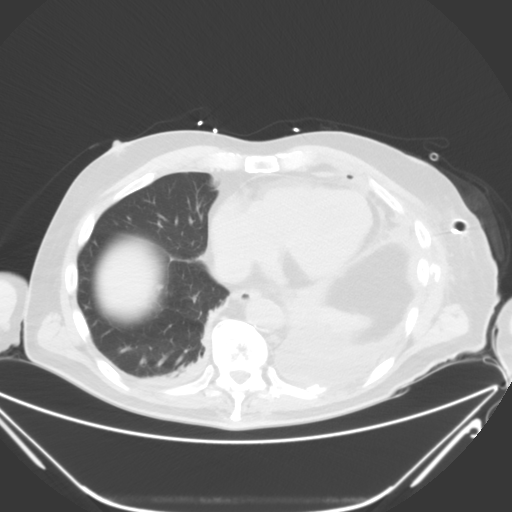
[im 35/68  mediastinal]
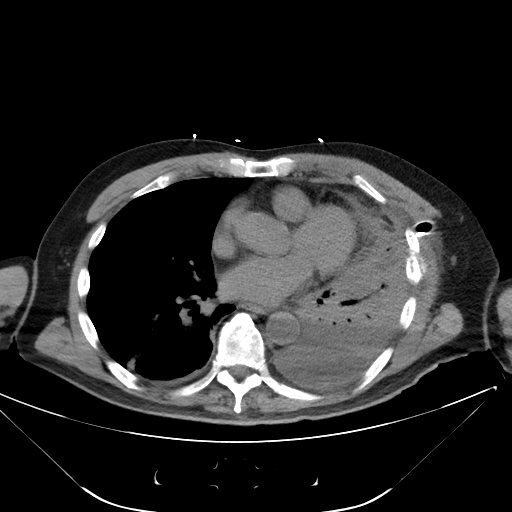
[im 35/68  lung]
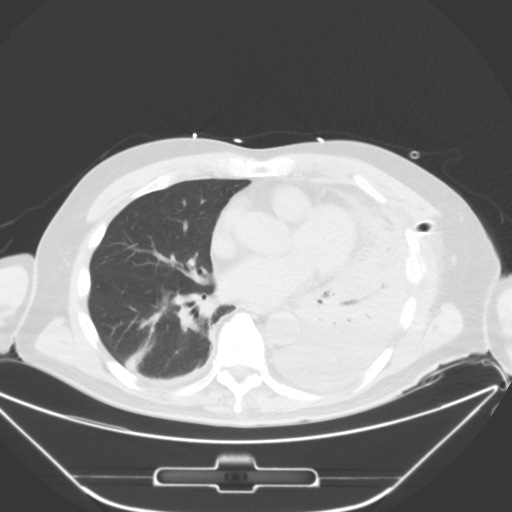
[im 40/68  lung]
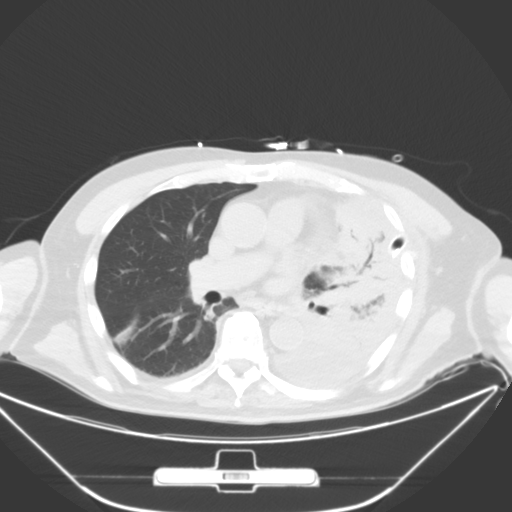
[im 48/68  lung]
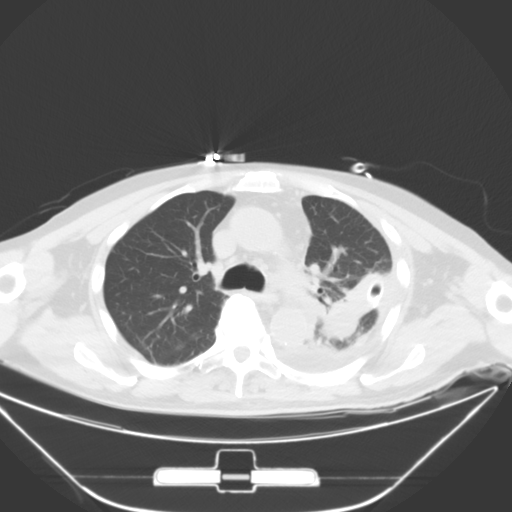
[im 55/68  lung]
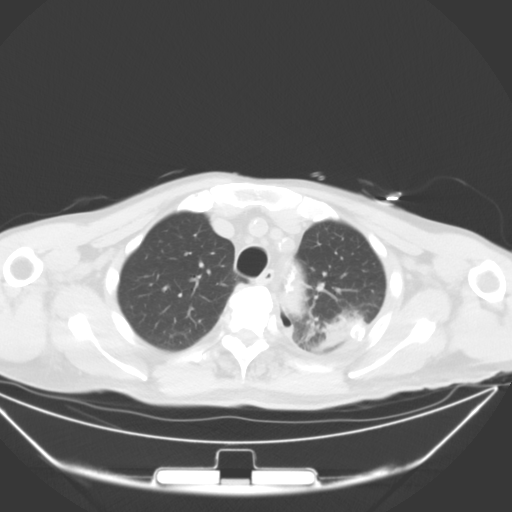
[im 63/68  mediastinal]
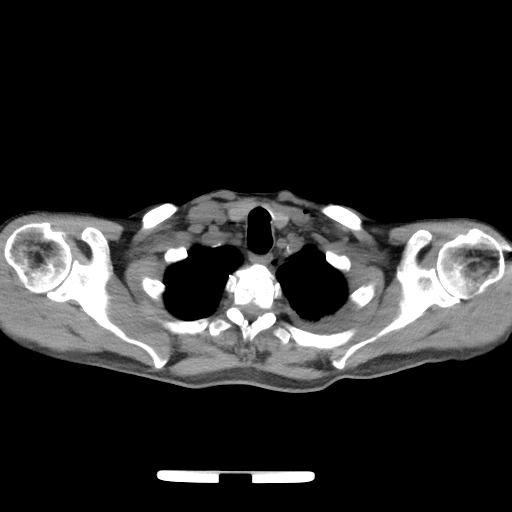
[im 63/68  lung]
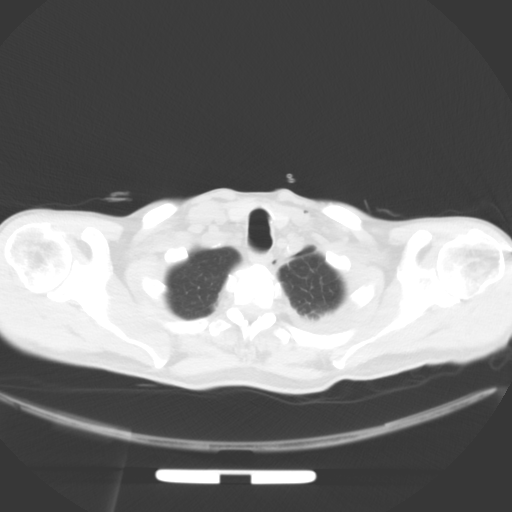

[Series 202: coronal, idose (2) · coronal · 0.45mm/px · 3 of 116 slices shown]
[im 24/116  lung]
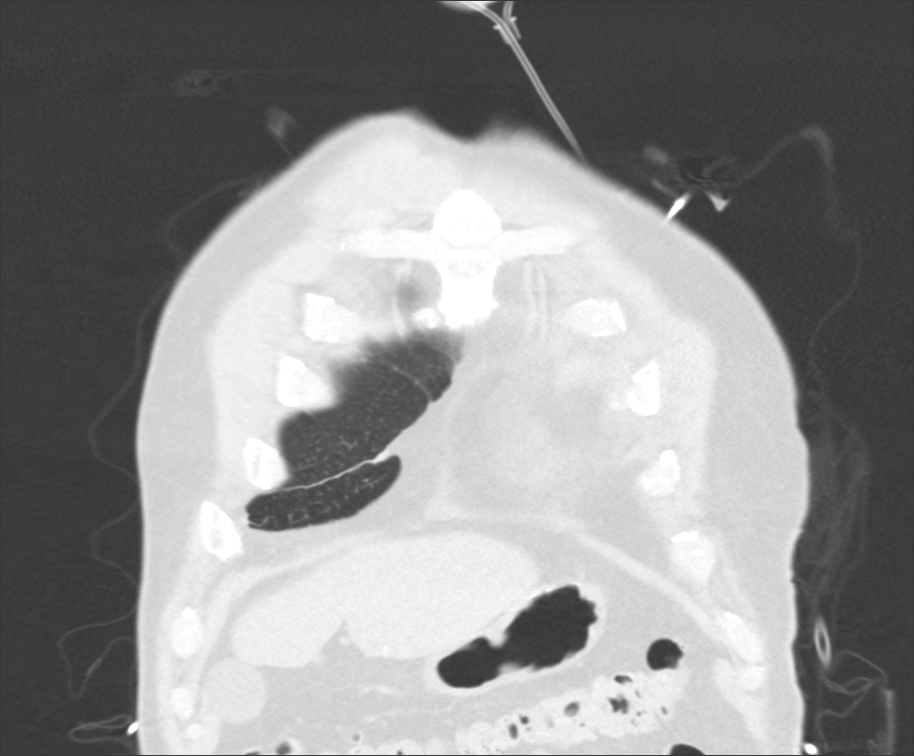
[im 47/116  lung]
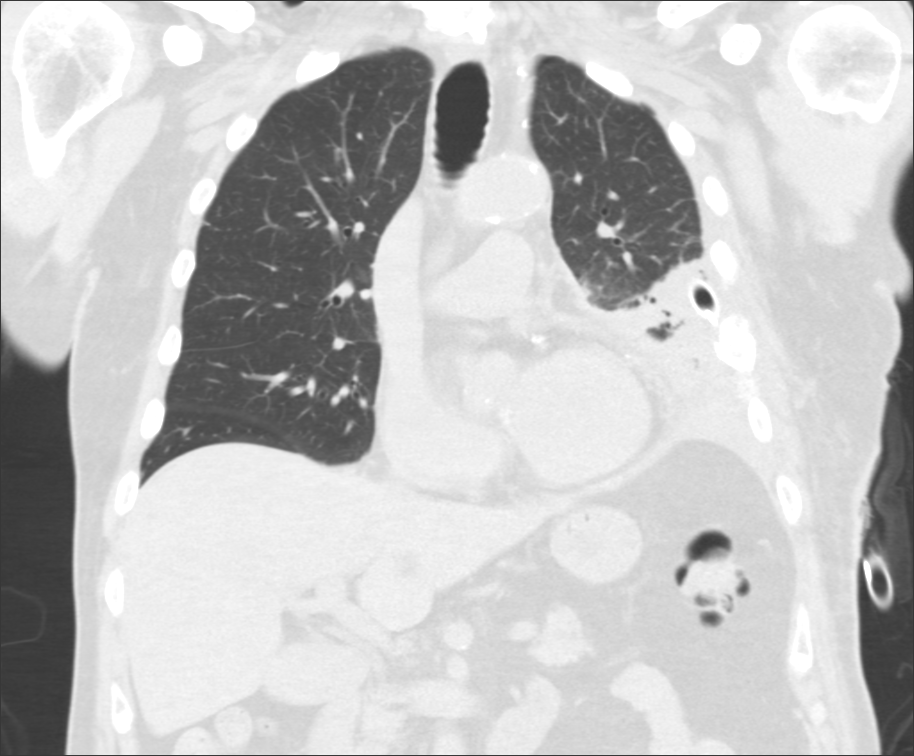
[im 70/116  lung]
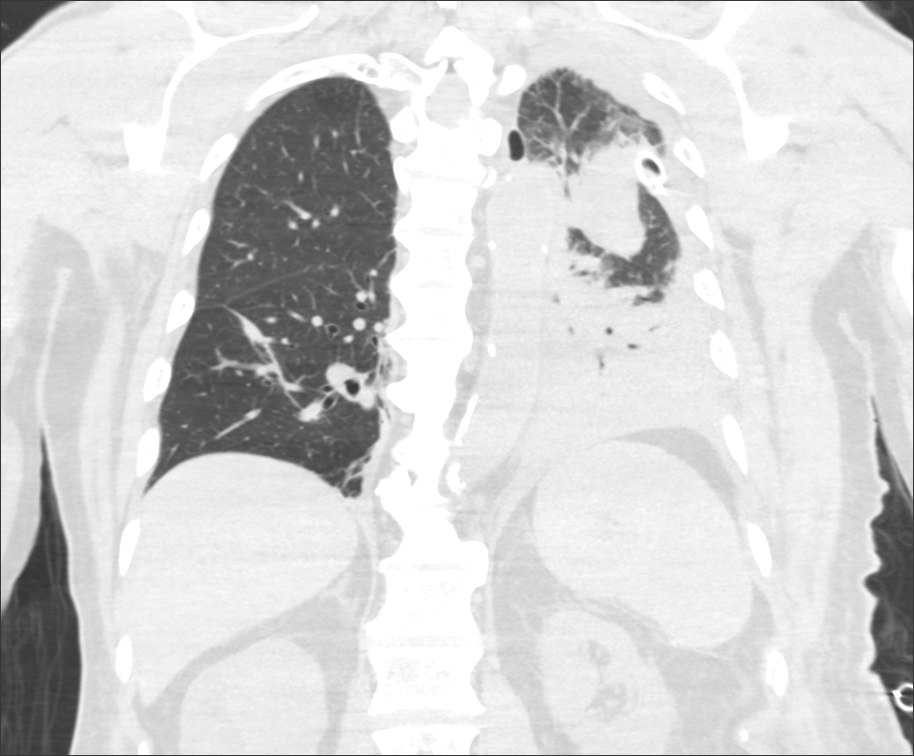

[12 of 36 positions shown; findings below may reference images not displayed]

FINDINGS: Mediastinum/Nodes: No axillary or supraclavicular adenopathy. Small
paratracheal lymph nodes present. Esophagus is normal. No
pericardial fluid. Coronary artery calcifications are present.

Lungs/Pleura: Left chest tube extends along the oblique fissure into
the upper left hemi thorax. A tiny pneumothorax laterally at the
left lung apex (image 14, series 205). There is a small to moderate
left pleural effusion. There is dense consolidation within the left
lower lobe with air bronchograms. Right lung is clear.

Upper abdomen: Low-density lesions within the liver likely represent
cysts.

There is a subcapsular fluid collection along the lateral margin of
the right kidney entirety kidney is not imaged. This subcapsular
collection is relatively low density. Findings not changed from
05/13/2014.

Musculoskeletal: Multiple left lateral rib fractures again noted.
IMPRESSION: 1. Dense left lower lobe consolidation with air bronchograms is most
consistent with left lower lobe pneumonia.
2. Trace left pneumothorax with chest tube in place with moderate
left pleural effusion.
3. Multiple left rib fractures.
4. Right subcapsular perinephric hematoma not changed.

## 2016-01-27 ENCOUNTER — Encounter: Payer: Self-pay | Admitting: Cardiology

## 2016-01-28 ENCOUNTER — Other Ambulatory Visit: Payer: Self-pay | Admitting: Family Medicine
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
## 2016-02-02 ENCOUNTER — Encounter: Payer: Self-pay | Admitting: Cardiology

## 2016-02-02 ENCOUNTER — Ambulatory Visit (INDEPENDENT_AMBULATORY_CARE_PROVIDER_SITE_OTHER): Payer: Medicare Other | Admitting: Cardiology

## 2016-02-02 VITALS — BP 142/68 | HR 78 | Ht 68.0 in | Wt 160.0 lb

## 2016-02-02 DIAGNOSIS — I2584 Coronary atherosclerosis due to calcified coronary lesion: Secondary | ICD-10-CM

## 2016-02-02 DIAGNOSIS — I480 Paroxysmal atrial fibrillation: Secondary | ICD-10-CM

## 2016-02-02 DIAGNOSIS — I2510 Atherosclerotic heart disease of native coronary artery without angina pectoris: Secondary | ICD-10-CM

## 2016-02-02 NOTE — Patient Instructions (Signed)
Medication Instructions:  The current medical regimen is effective;  continue present plan and medications.  Testing/Procedures: Your physician has requested that you have an exercise tolerance test. For further information please visit [email or web address]. Please also follow instruction sheet, as given.  Follow-Up: Follow up will be determined after your treadmill test.  If you need a refill on your cardiac medications before your next appointment, please call your pharmacy.  Thank you for choosing [facility]!!

## 2016-02-23 ENCOUNTER — Other Ambulatory Visit: Payer: Self-pay | Admitting: Family Medicine

## 2016-02-29 ENCOUNTER — Ambulatory Visit (INDEPENDENT_AMBULATORY_CARE_PROVIDER_SITE_OTHER): Payer: Medicare Other

## 2016-02-29 DIAGNOSIS — I2510 Atherosclerotic heart disease of native coronary artery without angina pectoris: Secondary | ICD-10-CM

## 2016-02-29 DIAGNOSIS — I2584 Coronary atherosclerosis due to calcified coronary lesion: Secondary | ICD-10-CM

## 2016-02-29 LAB — EXERCISE TOLERANCE TEST
CHL CUP MPHR: 137 {beats}/min
CHL CUP RESTING HR STRESS: 88 {beats}/min
CHL RATE OF PERCEIVED EXERTION: 15
CSEPEDS: 0 s
CSEPEW: 8.5 METS
Exercise duration (min): 7 min
Peak HR: 139 {beats}/min
Percent HR: 101 %

## 2016-05-02 ENCOUNTER — Other Ambulatory Visit: Payer: Self-pay | Admitting: Family Medicine

## 2016-05-10 ENCOUNTER — Encounter: Payer: Self-pay | Admitting: Family Medicine

## 2016-05-10 ENCOUNTER — Ambulatory Visit (INDEPENDENT_AMBULATORY_CARE_PROVIDER_SITE_OTHER): Payer: Medicare Other | Admitting: Family Medicine

## 2016-05-10 VITALS — BP 159/80 | HR 89 | Temp 97.4°F | Ht 68.0 in | Wt 161.4 lb

## 2016-05-10 DIAGNOSIS — M79602 Pain in left arm: Secondary | ICD-10-CM

## 2016-05-10 MED ORDER — MELOXICAM 7.5 MG PO TABS: 7.5000 mg | ORAL_TABLET | Freq: Every day | ORAL | 0 refills | Status: DC
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
## 2016-05-15 DIAGNOSIS — M9901 Segmental and somatic dysfunction of cervical region: Secondary | ICD-10-CM | POA: Diagnosis not present

## 2016-05-15 DIAGNOSIS — M9902 Segmental and somatic dysfunction of thoracic region: Secondary | ICD-10-CM | POA: Diagnosis not present

## 2016-05-15 DIAGNOSIS — M9907 Segmental and somatic dysfunction of upper extremity: Secondary | ICD-10-CM | POA: Diagnosis not present

## 2016-05-15 DIAGNOSIS — M531 Cervicobrachial syndrome: Secondary | ICD-10-CM | POA: Diagnosis not present

## 2016-05-17 DIAGNOSIS — M9901 Segmental and somatic dysfunction of cervical region: Secondary | ICD-10-CM | POA: Diagnosis not present

## 2016-05-17 DIAGNOSIS — M531 Cervicobrachial syndrome: Secondary | ICD-10-CM | POA: Diagnosis not present

## 2016-05-17 DIAGNOSIS — M9907 Segmental and somatic dysfunction of upper extremity: Secondary | ICD-10-CM | POA: Diagnosis not present

## 2016-05-17 DIAGNOSIS — M9902 Segmental and somatic dysfunction of thoracic region: Secondary | ICD-10-CM | POA: Diagnosis not present

## 2016-05-18 DIAGNOSIS — M9902 Segmental and somatic dysfunction of thoracic region: Secondary | ICD-10-CM | POA: Diagnosis not present

## 2016-05-18 DIAGNOSIS — M9901 Segmental and somatic dysfunction of cervical region: Secondary | ICD-10-CM | POA: Diagnosis not present

## 2016-05-18 DIAGNOSIS — M531 Cervicobrachial syndrome: Secondary | ICD-10-CM | POA: Diagnosis not present

## 2016-05-18 DIAGNOSIS — M9907 Segmental and somatic dysfunction of upper extremity: Secondary | ICD-10-CM | POA: Diagnosis not present

## 2016-05-23 ENCOUNTER — Other Ambulatory Visit: Payer: Self-pay | Admitting: Family Medicine

## 2016-05-23 DIAGNOSIS — M9907 Segmental and somatic dysfunction of upper extremity: Secondary | ICD-10-CM | POA: Diagnosis not present

## 2016-05-23 DIAGNOSIS — M531 Cervicobrachial syndrome: Secondary | ICD-10-CM | POA: Diagnosis not present

## 2016-05-23 DIAGNOSIS — M9902 Segmental and somatic dysfunction of thoracic region: Secondary | ICD-10-CM | POA: Diagnosis not present

## 2016-05-23 DIAGNOSIS — M9901 Segmental and somatic dysfunction of cervical region: Secondary | ICD-10-CM | POA: Diagnosis not present

## 2016-05-25 ENCOUNTER — Encounter: Payer: Self-pay | Admitting: Family Medicine

## 2016-05-25 ENCOUNTER — Ambulatory Visit (INDEPENDENT_AMBULATORY_CARE_PROVIDER_SITE_OTHER): Payer: Medicare Other | Admitting: Family Medicine

## 2016-05-25 VITALS — BP 133/77 | HR 87 | Temp 97.4°F | Ht 68.0 in | Wt 161.0 lb

## 2016-05-25 DIAGNOSIS — B029 Zoster without complications: Secondary | ICD-10-CM | POA: Diagnosis not present

## 2016-05-25 DIAGNOSIS — M79602 Pain in left arm: Secondary | ICD-10-CM | POA: Diagnosis not present

## 2016-05-25 MED ORDER — CYCLOBENZAPRINE HCL 10 MG PO TABS: 10.0000 mg | ORAL_TABLET | Freq: Three times a day (TID) | ORAL | 0 refills | Status: DC | PRN

## 2016-05-25 MED ORDER — VALACYCLOVIR HCL 1 G PO TABS: 1000.0000 mg | ORAL_TABLET | Freq: Two times a day (BID) | ORAL | 0 refills | Status: DC

## 2016-05-25 MED ORDER — MELOXICAM 7.5 MG PO TABS: 7.5000 mg | ORAL_TABLET | Freq: Every day | ORAL | 0 refills | Status: DC

## 2016-05-25 NOTE — Progress Notes (Signed)
BP 133/77   Pulse 87   Temp 97.4 F (36.3 C) (Oral)   Ht 5\' 8"  (1.727 m)   Wt 161 lb (73 kg)   BMI 24.48 kg/m    Subjective:    Patient ID: Brian Blankenship, male    DOB: 10/19/1930, 83 y.o.   MRN: [id number]  HPI: EDU ON is a 83 y.o. male presenting on 05/25/2016 for Left arm pain (ongoing since 05/10/16 when he saw Dr. Sabra Heck, describes as uncomfortable, worse at night, reports a hot shower makes it less painful; painful rash broke out on left hand 5 days ago - patient believes it was caused by the Mobic Dr. Sabra Heck prescribed, but he started that on 05/11/16.)   HPI Rash Patient is coming in with a rash that started over the past 5 days. The rashes on the palmar side of his left hand on the medial aspect. It goes up onto his fourth and fifth fingers. He said the rash started out like pustules that were full but is now scabbed over mostly except for a few small pustules that have cropped up. He says it was very painful initially when he started 5 days ago but the pain has subsided greatly over the past couple days. He denies any pruritus. He denies any fevers or chills or purulent drainage or redness or warmth surrounding the rash. He has about 8 lesions on his hand and fingers total. There is no rash on the dorsum side of his hand or on his thumb and first 2 fingers.  Left arm pain Patient is coming in with continued left arm pain that's been going on for a month. He was taking meloxicam and it did seem to improve in the a.m. but then at night he was still having a lot of issues when he lays on that side especially and has troubles getting comfortable in the evening. The pain goes from his left elbow to his left shoulder but is mostly centralized around the lateral elbow. He says when he reaches down or reaches across to his body he does get sometimes a shooting pain through his arm and his shoulder. He denies any numbness or weakness or redness or warmth or fevers or chills. He didn't feel  like it is getting a lot better especially during the daytime with the meloxicam.  Relevant past medical, surgical, family and social history reviewed and updated as indicated. Interim medical history since our last visit reviewed. Allergies and medications reviewed and updated.  Review of Systems  Constitutional: Negative for chills and fever.  Respiratory: Negative for shortness of breath and wheezing.   Cardiovascular: Negative for chest pain and leg swelling.  Musculoskeletal: Positive for arthralgias. Negative for back pain, gait problem and joint swelling.  Skin: Positive for rash.  All other systems reviewed and are negative.   Per HPI unless specifically indicated above     Objective:    BP 133/77   Pulse 87   Temp 97.4 F (36.3 C) (Oral)   Ht 5\' 8"  (1.727 m)   Wt 161 lb (73 kg)   BMI 24.48 kg/m   Wt Readings from Last 3 Encounters:  05/25/16 161 lb (73 kg)  05/10/16 161 lb 6.4 oz (73.2 kg)  02/02/16 160 lb (72.6 kg)    Physical Exam  Constitutional: He is oriented to person, place, and time. He appears well-developed and well-nourished. No distress.  Eyes: Conjunctivae are normal. No scleral icterus.  Musculoskeletal: Normal range of  motion. He exhibits no edema.       Left shoulder: He exhibits normal range of motion, no tenderness (No tenderness elicited on exam or with range of motion.), no swelling and normal strength.       Left elbow: He exhibits normal range of motion and no swelling. No tenderness (No tenderness elicited on exam or with range of motion of elbow) found.  Neurological: He is alert and oriented to person, place, and time. Coordination normal.  Skin: Skin is warm and dry. Rash noted. Rash is vesicular (Few small vesicles on the lateral aspect of the fourth digit on the left hand. The rest of the lesions are crusted over on the palmar aspect of his hand on the medial side. Following ulnar distribution). He is not diaphoretic.  Psychiatric: He has a  normal mood and affect. His behavior is normal.  Nursing note and vitals reviewed.     Assessment & Plan:   Problem List Items Addressed This Visit    None    Visit Diagnoses    Left arm pain    -  Primary   Likely shoulder strain versus arthritis, continue meloxicam in a.m. and Flexeril to help him sleep in the p.m.   Relevant Medications   cyclobenzaprine (FLEXERIL) 10 MG tablet   meloxicam (MOBIC) 7.5 MG tablet   Herpes zoster without complication       Relevant Medications   valACYclovir (VALTREX) 1000 MG tablet       Follow up plan: Return if symptoms worsen or fail to improve.  Counseling provided for all of the vaccine components No orders of the defined types were placed in this encounter.   Caryl Pina, MD [facility] 05/25/2016, 11:36 AM

## 2016-05-31 ENCOUNTER — Encounter: Payer: Self-pay | Admitting: Family Medicine

## 2016-05-31 ENCOUNTER — Ambulatory Visit (INDEPENDENT_AMBULATORY_CARE_PROVIDER_SITE_OTHER): Payer: Medicare Other | Admitting: Family Medicine

## 2016-05-31 VITALS — BP 129/62 | HR 85 | Temp 96.9°F | Ht 68.0 in | Wt 160.0 lb

## 2016-05-31 DIAGNOSIS — D692 Other nonthrombocytopenic purpura: Secondary | ICD-10-CM | POA: Diagnosis not present

## 2016-05-31 DIAGNOSIS — B029 Zoster without complications: Secondary | ICD-10-CM

## 2016-05-31 DIAGNOSIS — E785 Hyperlipidemia, unspecified: Secondary | ICD-10-CM

## 2016-05-31 DIAGNOSIS — N183 Chronic kidney disease, stage 3 unspecified: Secondary | ICD-10-CM

## 2016-05-31 DIAGNOSIS — E559 Vitamin D deficiency, unspecified: Secondary | ICD-10-CM | POA: Diagnosis not present

## 2016-05-31 DIAGNOSIS — I10 Essential (primary) hypertension: Secondary | ICD-10-CM

## 2016-05-31 DIAGNOSIS — I480 Paroxysmal atrial fibrillation: Secondary | ICD-10-CM | POA: Diagnosis not present

## 2016-05-31 DIAGNOSIS — I700 Atherosclerosis of aorta: Secondary | ICD-10-CM

## 2016-05-31 DIAGNOSIS — D696 Thrombocytopenia, unspecified: Secondary | ICD-10-CM
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
## 2016-05-31 NOTE — Patient Instructions (Addendum)
Medicare Annual Wellness Visit  [facility] and the medical providers at [facility] strive to bring you the best medical care.  In doing so we not only want to address your current medical conditions and concerns but also to detect new conditions early and prevent illness, disease and health-related problems.    Medicare offers a yearly Wellness Visit which allows our clinical staff to assess your need for preventative services including immunizations, lifestyle education, counseling to decrease risk of preventable diseases and screening for fall risk and other medical concerns.    This visit is provided free of charge (no copay) for all Medicare recipients. The clinical pharmacists at [facility] have begun to conduct these Wellness Visits which will also include a thorough review of all your medications.    As you primary medical provider recommend that you make an appointment for your Annual Wellness Visit if you have not done so already this year.  You may set up this appointment before you leave today or you may call back ([phone]) and schedule an appointment.  Please make sure when you call that you mention that you are scheduling your Annual Wellness Visit with the clinical pharmacist so that the appointment may be made for the proper length of time.     Continue current medications. Continue good therapeutic lifestyle changes which include good diet and exercise. Fall precautions discussed with patient. If an FOBT was given today- please return it to our front desk. If you are over 36 years old - you may need Prevnar 45 or the adult Pneumonia vaccine.  **Flu shots are available--- please call and schedule a FLU-CLINIC appointment**  After your visit with [place] today you will receive a survey in the mail or online from [employer] regarding your care with [place]. Please take a moment to fill this out. Your feedback is very  important to [place] as you can help [place] better understand your patient needs as well as improve your experience and satisfaction. WE CARE ABOUT YOU!!!   Continue to take and complete all of the Valtrex for the shingles Follow-up with visit to urologist as planned Do not forget to get your eye exam We will call with the results of the blood work as soon as it becomes available and make sure that you take a copy of this with you to your urology visit with Dr. Rosana Hoes We will see you back in about 6 weeks to follow-up again on the resolving shingles.

## 2016-05-31 NOTE — Addendum Note (Signed)
Addended by: Zannie Cove on: 05/31/2016 09:28 AM   Modules accepted: Orders

## 2016-06-01 LAB — CBC WITH DIFFERENTIAL/PLATELET
BASOS ABS: 0 10*3/uL (ref 0.0–0.2)
BASOS: 1 %
EOS (ABSOLUTE): 0.2 10*3/uL (ref 0.0–0.4)
EOS: 3 %
HEMATOCRIT: 41.2 % (ref 37.5–51.0)
HEMOGLOBIN: 14.3 g/dL (ref 13.0–17.7)
IMMATURE GRANS (ABS): 0 10*3/uL (ref 0.0–0.1)
Immature Granulocytes: 0 %
LYMPHS: 16 %
Lymphocytes Absolute: 0.9 10*3/uL (ref 0.7–3.1)
MCH: 31.9 pg (ref 26.6–33.0)
MCHC: 34.7 g/dL (ref 31.5–35.7)
MCV: 92 fL (ref 79–97)
MONOCYTES: 10 %
Monocytes Absolute: 0.5 10*3/uL (ref 0.1–0.9)
Neutrophils Absolute: 3.8 10*3/uL (ref 1.4–7.0)
Neutrophils: 70 %
PLATELETS: 186 10*3/uL (ref 150–379)
RBC: 4.48 x10E6/uL (ref 4.14–5.80)
RDW: 12.7 % (ref 12.3–15.4)
WBC: 5.4 10*3/uL (ref 3.4–10.8)

## 2016-06-01 LAB — NMR, LIPOPROFILE
Cholesterol: 128 mg/dL (ref 100–199)
HDL CHOLESTEROL BY NMR: 54 mg/dL (ref >39)
HDL Particle Number: 33.8 umol/L (ref >=30.5)
LDL Particle Number: 737 nmol/L (ref <1000)
LDL SIZE: 20.3 nm (ref >20.5)
LDL-C: 60 mg/dL (ref 0–99)
LP-IR SCORE: 35 (ref <=45)
SMALL LDL PARTICLE NUMBER: 508 nmol/L (ref <=527)
Triglycerides by NMR: 71 mg/dL (ref 0–149)

## 2016-06-01 LAB — BMP8+EGFR
BUN/Creatinine Ratio: 10 (ref 10–24)
BUN: 15 mg/dL (ref 8–27)
CALCIUM: 9.5 mg/dL (ref 8.6–10.2)
CHLORIDE: 98 mmol/L (ref 96–106)
CO2: 26 mmol/L (ref 18–29)
Creatinine, Ser: 1.49 mg/dL — ABNORMAL HIGH (ref 0.76–1.27)
GFR calc Af Amer: 49 mL/min/{1.73_m2} — ABNORMAL LOW (ref >59)
GFR calc non Af Amer: 43 mL/min/{1.73_m2} — ABNORMAL LOW (ref >59)
Glucose: 86 mg/dL (ref 65–99)
POTASSIUM: 4.4 mmol/L (ref 3.5–5.2)
Sodium: 138 mmol/L (ref 134–144)

## 2016-06-01 LAB — VITAMIN D 25 HYDROXY (VIT D DEFICIENCY, FRACTURES): Vit D, 25-Hydroxy: 40.4 ng/mL (ref 30.0–100.0)

## 2016-06-01 LAB — HEPATIC FUNCTION PANEL
ALBUMIN: 4.2 g/dL (ref 3.5–4.7)
ALT: 16 IU/L (ref 0–44)
AST: 22 IU/L (ref 0–40)
Alkaline Phosphatase: 69 IU/L (ref 39–117)
BILIRUBIN TOTAL: 0.8 mg/dL (ref 0.0–1.2)
Bilirubin, Direct: 0.23 mg/dL (ref 0.00–0.40)
TOTAL PROTEIN: 6.5 g/dL (ref 6.0–8.5)

## 2016-06-03 ENCOUNTER — Other Ambulatory Visit: Payer: Medicare Other

## 2016-06-03 DIAGNOSIS — Z1211 Encounter for screening for malignant neoplasm of colon: Secondary | ICD-10-CM

## 2016-06-05 ENCOUNTER — Other Ambulatory Visit: Payer: Self-pay | Admitting: *Deleted

## 2016-06-05 DIAGNOSIS — Z1211 Encounter for screening for malignant neoplasm of colon: Secondary | ICD-10-CM

## 2016-06-07 ENCOUNTER — Telehealth: Payer: Self-pay | Admitting: Family Medicine

## 2016-06-07 NOTE — Telephone Encounter (Signed)
Patient called stating that lab results states that he is to only take tylenol as needed for pain and to avoid NSAIDs.  Patient states that he does not have any pain and wants to know if he can take tylenol.  Informed patient that if he is allergic to tylenol do not take it.  Patient verbalized understanding.

## 2016-06-08 LAB — FECAL OCCULT BLOOD, IMMUNOCHEMICAL: FECAL OCCULT BLD: NEGATIVE

## 2016-06-13 ENCOUNTER — Other Ambulatory Visit: Payer: Self-pay | Admitting: Family Medicine

## 2016-06-23 DIAGNOSIS — N401 Enlarged prostate with lower urinary tract symptoms: Secondary | ICD-10-CM | POA: Diagnosis not present

## 2016-06-23 DIAGNOSIS — R311 Benign essential microscopic hematuria: Secondary | ICD-10-CM | POA: Diagnosis not present

## 2016-06-23 DIAGNOSIS — N138 Other obstructive and reflux uropathy: Secondary | ICD-10-CM | POA: Diagnosis not present

## 2016-06-23 DIAGNOSIS — N183 Chronic kidney disease, stage 3 (moderate): Secondary | ICD-10-CM | POA: Diagnosis not present

## 2016-06-30 ENCOUNTER — Other Ambulatory Visit: Payer: Self-pay | Admitting: Family Medicine

## 2016-07-20 ENCOUNTER — Ambulatory Visit: Payer: Medicare Other | Admitting: Family Medicine

## 2016-08-21 ENCOUNTER — Other Ambulatory Visit: Payer: Self-pay | Admitting: Family Medicine

## 2016-09-25 ENCOUNTER — Telehealth: Payer: Self-pay

## 2016-09-25 NOTE — Telephone Encounter (Signed)
Patient wants to change to [place]/HCTZ  Due to recall

## 2016-09-26 ENCOUNTER — Telehealth: Payer: Self-pay | Admitting: Family Medicine

## 2016-09-26 MED ORDER — OLMESARTAN MEDOXOMIL-HCTZ 40-25 MG PO TABS: 1.0000 | ORAL_TABLET | Freq: Every day | ORAL | 5 refills | Status: DC

## 2016-09-26 NOTE — Telephone Encounter (Signed)
This is okay to make the change to the appropriate dosage from his previous medicine.

## 2016-09-26 NOTE — Telephone Encounter (Signed)
Valsartan changed to benicar 40/25- watch blood pressure

## 2016-09-27 NOTE — Telephone Encounter (Signed)
It was changed yesterday by covering provider

## 2016-09-27 NOTE — Telephone Encounter (Signed)
Please address  dont know what that dosage would be

## 2016-11-20 ENCOUNTER — Other Ambulatory Visit: Payer: Self-pay | Admitting: Family Medicine

## 2016-11-28 ENCOUNTER — Encounter: Payer: Self-pay | Admitting: Family Medicine

## 2016-11-28 ENCOUNTER — Ambulatory Visit (INDEPENDENT_AMBULATORY_CARE_PROVIDER_SITE_OTHER): Payer: Medicare Other | Admitting: Family Medicine

## 2016-11-28 VITALS — BP 126/64 | HR 74 | Temp 96.9°F | Ht 68.0 in | Wt 157.0 lb

## 2016-11-28 DIAGNOSIS — E785 Hyperlipidemia, unspecified: Secondary | ICD-10-CM | POA: Diagnosis not present

## 2016-11-28 DIAGNOSIS — N183 Chronic kidney disease, stage 3 unspecified: Secondary | ICD-10-CM

## 2016-11-28 DIAGNOSIS — M109 Gout, unspecified: Secondary | ICD-10-CM | POA: Diagnosis not present

## 2016-11-28 DIAGNOSIS — I10 Essential (primary) hypertension: Secondary | ICD-10-CM

## 2016-11-28 DIAGNOSIS — D696 Thrombocytopenia, unspecified: Secondary | ICD-10-CM

## 2016-11-28 DIAGNOSIS — E559 Vitamin D deficiency, unspecified: Secondary | ICD-10-CM | POA: Diagnosis not present

## 2016-11-28 DIAGNOSIS — I700 Atherosclerosis of aorta: Secondary | ICD-10-CM

## 2016-11-28 DIAGNOSIS — I480 Paroxysmal atrial fibrillation: Secondary | ICD-10-CM | POA: Diagnosis not present

## 2016-11-28 DIAGNOSIS — Z23 Encounter for immunization: Secondary | ICD-10-CM | POA: Diagnosis not present

## 2016-11-28 DIAGNOSIS — D692 Other nonthrombocytopenic purpura: Secondary | ICD-10-CM

## 2016-11-28 DIAGNOSIS — L409 Psoriasis, unspecified: Secondary | ICD-10-CM | POA: Diagnosis not present
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
## 2016-11-28 NOTE — Patient Instructions (Addendum)
Medicare Annual Wellness Visit  [facility] and the medical providers at [facility] strive to bring you the best medical care.  In doing so we not only want to address your current medical conditions and concerns but also to detect new conditions early and prevent illness, disease and health-related problems.    Medicare offers a yearly Wellness Visit which allows our clinical staff to assess your need for preventative services including immunizations, lifestyle education, counseling to decrease risk of preventable diseases and screening for fall risk and other medical concerns.    This visit is provided free of charge (no copay) for all Medicare recipients. The clinical pharmacists at [facility] have begun to conduct these Wellness Visits which will also include a thorough review of all your medications.    As you primary medical provider recommend that you make an appointment for your Annual Wellness Visit if you have not done so already this year.  You may set up this appointment before you leave today or you may call back ([phone]) and schedule an appointment.  Please make sure when you call that you mention that you are scheduling your Annual Wellness Visit with the clinical pharmacist so that the appointment may be made for the proper length of time.     Continue current medications. Continue good therapeutic lifestyle changes which include good diet and exercise. Fall precautions discussed with patient. If an FOBT was given today- please return it to our front desk. If you are over 34 years old - you may need Prevnar 54 or the adult Pneumonia vaccine.  **Flu shots are available--- please call and schedule a FLU-CLINIC appointment**  After your visit with [place] today you will receive a survey in the mail or online from [employer] regarding your care with [place]. Please take a moment to fill this out. Your feedback is very  important to [place] as you can help [place] better understand your patient needs as well as improve your experience and satisfaction. WE CARE ABOUT YOU!!!   Take Prilosec over-the-counter 1 daily before breakfast If this does not relieve the belching after 2-3 weeks give [place] a call and we will set up an appointment with Dr. Rosalin Hawking for further evaluation Drink more water Use hand sanitizer is when in the public Avoid irritating environments This winter use cool mist humidification and keep the house as cool as possible Stay active physically Remind the dermatologist to check the 2 areas on your upper back Keep follow-up appointment with urology and take a copy of blood work with you to the urologist and to the gastroenterologist Return the FOBT

## 2016-11-29 LAB — CBC WITH DIFFERENTIAL/PLATELET
BASOS: 1 %
Basophils Absolute: 0.1 10*3/uL (ref 0.0–0.2)
EOS (ABSOLUTE): 0.2 10*3/uL (ref 0.0–0.4)
EOS: 3 %
HEMATOCRIT: 42 % (ref 37.5–51.0)
HEMOGLOBIN: 14.6 g/dL (ref 13.0–17.7)
Immature Grans (Abs): 0 10*3/uL (ref 0.0–0.1)
Immature Granulocytes: 0 %
LYMPHS ABS: 1 10*3/uL (ref 0.7–3.1)
Lymphs: 16 %
MCH: 31.9 pg (ref 26.6–33.0)
MCHC: 34.8 g/dL (ref 31.5–35.7)
MCV: 92 fL (ref 79–97)
MONOCYTES: 12 %
Monocytes Absolute: 0.7 10*3/uL (ref 0.1–0.9)
NEUTROS ABS: 4.1 10*3/uL (ref 1.4–7.0)
Neutrophils: 68 %
Platelets: 178 10*3/uL (ref 150–379)
RBC: 4.58 x10E6/uL (ref 4.14–5.80)
RDW: 13 % (ref 12.3–15.4)
WBC: 6.1 10*3/uL (ref 3.4–10.8)

## 2016-11-29 LAB — HEPATIC FUNCTION PANEL
ALBUMIN: 4.4 g/dL (ref 3.5–4.7)
ALK PHOS: 68 IU/L (ref 39–117)
ALT: 17 IU/L (ref 0–44)
AST: 24 IU/L (ref 0–40)
BILIRUBIN TOTAL: 0.9 mg/dL (ref 0.0–1.2)
Bilirubin, Direct: 0.24 mg/dL (ref 0.00–0.40)
Total Protein: 6.5 g/dL (ref 6.0–8.5)

## 2016-11-29 LAB — BMP8+EGFR
BUN / CREAT RATIO: 10 (ref 10–24)
BUN: 13 mg/dL (ref 8–27)
CALCIUM: 9.2 mg/dL (ref 8.6–10.2)
CO2: 27 mmol/L (ref 20–29)
Chloride: 101 mmol/L (ref 96–106)
Creatinine, Ser: 1.35 mg/dL — ABNORMAL HIGH (ref 0.76–1.27)
GFR, EST AFRICAN AMERICAN: 55 mL/min/{1.73_m2} — AB (ref >59)
GFR, EST NON AFRICAN AMERICAN: 48 mL/min/{1.73_m2} — AB (ref >59)
Glucose: 94 mg/dL (ref 65–99)
Potassium: 4.4 mmol/L (ref 3.5–5.2)
Sodium: 140 mmol/L (ref 134–144)

## 2016-11-29 LAB — LIPID PANEL
CHOL/HDL RATIO: 2.1 ratio (ref 0.0–5.0)
CHOLESTEROL TOTAL: 128 mg/dL (ref 100–199)
HDL: 62 mg/dL (ref >39)
LDL Calculated: 55 mg/dL (ref 0–99)
TRIGLYCERIDES: 55 mg/dL (ref 0–149)
VLDL Cholesterol Cal: 11 mg/dL (ref 5–40)

## 2016-11-29 LAB — VITAMIN D 25 HYDROXY (VIT D DEFICIENCY, FRACTURES): Vit D, 25-Hydroxy: 42.6 ng/mL (ref 30.0–100.0)

## 2016-11-29 LAB — URIC ACID: Uric Acid: 6 mg/dL (ref 3.7–8.6)

## 2016-11-30 ENCOUNTER — Telehealth: Payer: Self-pay | Admitting: Family Medicine

## 2016-11-30 NOTE — Telephone Encounter (Signed)
lmtcb jkp 10/11

## 2016-12-01 ENCOUNTER — Encounter: Payer: Self-pay | Admitting: *Deleted

## 2016-12-07 ENCOUNTER — Telehealth: Payer: Self-pay | Admitting: Family Medicine

## 2016-12-07 NOTE — Telephone Encounter (Signed)
Try MiraLAX over-the-counter and use as directed and increase fluid intake

## 2016-12-07 NOTE — Telephone Encounter (Signed)
Pt aware.

## 2016-12-07 NOTE — Telephone Encounter (Signed)
Having some issues with constipation = he has went some  - has had to strain - somewhat uncomfortable   Took "soft stool" otc - helped  some   Any other suggestions?
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
## 2016-12-23 ENCOUNTER — Other Ambulatory Visit: Payer: Self-pay | Admitting: Family Medicine

## 2016-12-26 ENCOUNTER — Other Ambulatory Visit: Payer: Self-pay | Admitting: Family Medicine

## 2016-12-27 ENCOUNTER — Telehealth: Payer: Self-pay | Admitting: Family Medicine

## 2016-12-27 NOTE — Telephone Encounter (Signed)
Flu is documented  - in immunizations 11/28/16.

## 2016-12-29 DIAGNOSIS — N183 Chronic kidney disease, stage 3 (moderate): Secondary | ICD-10-CM | POA: Diagnosis not present

## 2016-12-29 DIAGNOSIS — N200 Calculus of kidney: Secondary | ICD-10-CM | POA: Diagnosis not present

## 2016-12-29 DIAGNOSIS — N401 Enlarged prostate with lower urinary tract symptoms: Secondary | ICD-10-CM | POA: Diagnosis not present

## 2017-01-08 DIAGNOSIS — T490X1A Poisoning by local antifungal, anti-infective and anti-inflammatory drugs, accidental (unintentional), initial encounter: Secondary | ICD-10-CM | POA: Diagnosis not present

## 2017-01-08 DIAGNOSIS — L853 Xerosis cutis: Secondary | ICD-10-CM | POA: Diagnosis not present

## 2017-01-08 DIAGNOSIS — T490X5A Adverse effect of local antifungal, anti-infective and anti-inflammatory drugs, initial encounter: Secondary | ICD-10-CM | POA: Diagnosis not present

## 2017-01-08 DIAGNOSIS — L821 Other seborrheic keratosis: Secondary | ICD-10-CM | POA: Diagnosis not present

## 2017-01-08 DIAGNOSIS — L570 Actinic keratosis: Secondary | ICD-10-CM | POA: Insufficient documentation

## 2017-01-08 DIAGNOSIS — L409 Psoriasis, unspecified: Secondary | ICD-10-CM | POA: Diagnosis not present

## 2017-01-08 DIAGNOSIS — T887XXA Unspecified adverse effect of drug or medicament, initial encounter: Secondary | ICD-10-CM | POA: Diagnosis not present

## 2017-01-08 DIAGNOSIS — D1800 Hemangioma unspecified site: Secondary | ICD-10-CM | POA: Diagnosis not present

## 2017-01-08 DIAGNOSIS — B351 Tinea unguium: Secondary | ICD-10-CM | POA: Diagnosis not present

## 2017-01-24 ENCOUNTER — Other Ambulatory Visit: Payer: Self-pay | Admitting: *Deleted

## 2017-01-24 MED ORDER — OLMESARTAN MEDOXOMIL-HCTZ 40-25 MG PO TABS: 1.0000 | ORAL_TABLET | Freq: Every day | ORAL | 0 refills | Status: DC

## 2017-01-30 ENCOUNTER — Other Ambulatory Visit: Payer: Self-pay | Admitting: Family Medicine

## 2017-02-17 ENCOUNTER — Other Ambulatory Visit: Payer: Self-pay | Admitting: Family Medicine

## 2017-04-03 ENCOUNTER — Other Ambulatory Visit: Payer: Self-pay | Admitting: Family Medicine

## 2017-04-17 ENCOUNTER — Encounter: Payer: Self-pay | Admitting: Family Medicine

## 2017-04-17 ENCOUNTER — Ambulatory Visit (INDEPENDENT_AMBULATORY_CARE_PROVIDER_SITE_OTHER): Payer: Medicare Other | Admitting: Family Medicine

## 2017-04-17 VITALS — BP 124/59 | HR 76 | Temp 96.8°F | Ht 68.0 in | Wt 160.0 lb

## 2017-04-17 DIAGNOSIS — R202 Paresthesia of skin: Secondary | ICD-10-CM | POA: Diagnosis not present

## 2017-04-17 DIAGNOSIS — I700 Atherosclerosis of aorta: Secondary | ICD-10-CM | POA: Diagnosis not present

## 2017-04-17 DIAGNOSIS — E785 Hyperlipidemia, unspecified: Secondary | ICD-10-CM

## 2017-04-17 DIAGNOSIS — E559 Vitamin D deficiency, unspecified: Secondary | ICD-10-CM | POA: Diagnosis not present

## 2017-04-17 DIAGNOSIS — I10 Essential (primary) hypertension: Secondary | ICD-10-CM

## 2017-04-17 DIAGNOSIS — R200 Anesthesia of skin: Secondary | ICD-10-CM

## 2017-04-17 DIAGNOSIS — N183 Chronic kidney disease, stage 3 unspecified: Secondary | ICD-10-CM

## 2017-04-17 DIAGNOSIS — D696 Thrombocytopenia, unspecified: Secondary | ICD-10-CM

## 2017-04-17 DIAGNOSIS — I480 Paroxysmal atrial fibrillation: Secondary | ICD-10-CM | POA: Diagnosis not present

## 2017-04-17 DIAGNOSIS — L409 Psoriasis, unspecified: Secondary | ICD-10-CM
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
## 2017-04-17 NOTE — Patient Instructions (Addendum)
Medicare Annual Wellness Visit  [facility] and the medical providers at [facility] strive to bring you the best medical care.  In doing so we not only want to address your current medical conditions and concerns but also to detect new conditions early and prevent illness, disease and health-related problems.    Medicare offers a yearly Wellness Visit which allows our clinical staff to assess your need for preventative services including immunizations, lifestyle education, counseling to decrease risk of preventable diseases and screening for fall risk and other medical concerns.    This visit is provided free of charge (no copay) for all Medicare recipients. The clinical pharmacists at [facility] have begun to conduct these Wellness Visits which will also include a thorough review of all your medications.    As you primary medical provider recommend that you make an appointment for your Annual Wellness Visit if you have not done so already this year.  You may set up this appointment before you leave today or you may call back ([phone]) and schedule an appointment.  Please make sure when you call that you mention that you are scheduling your Annual Wellness Visit with the clinical pharmacist so that the appointment may be made for the proper length of time.     Continue current medications. Continue good therapeutic lifestyle changes which include good diet and exercise. Fall precautions discussed with patient. If an FOBT was given today- please return it to our front desk. If you are over 41 years old - you may need Prevnar 33 or the adult Pneumonia vaccine.  **Flu shots are available--- please call and schedule a FLU-CLINIC appointment**  After your visit with [place] today you will receive a survey in the mail or online from [employer] regarding your care with [place]. Please take a moment to fill this out. Your feedback is very  important to [place] as you can help [place] better understand your patient needs as well as improve your experience and satisfaction. WE CARE ABOUT YOU!!!  Purchase wrist braces and wear these at nighttime when sleeping If the numbness and tingling continue in both hands after wearing these for 2-3 months to get back in touch with [place] and we will arrange for you to have testing for carpal tunnel disease. Continue to follow-up with a cardiologist and dermatologist and urologist as already doing Stay active physically and walk regularly Drink plenty of fluids and stay well-hydrated Make sure that the doctor doing the eye exam sends [place] a copy of that report

## 2017-04-18 LAB — LIPID PANEL
Chol/HDL Ratio: 2 ratio (ref 0.0–5.0)
Cholesterol, Total: 126 mg/dL (ref 100–199)
HDL: 62 mg/dL (ref >39)
LDL Calculated: 50 mg/dL (ref 0–99)
TRIGLYCERIDES: 70 mg/dL (ref 0–149)
VLDL CHOLESTEROL CAL: 14 mg/dL (ref 5–40)

## 2017-04-18 LAB — BMP8+EGFR
BUN/Creatinine Ratio: 11 (ref 10–24)
BUN: 15 mg/dL (ref 8–27)
CALCIUM: 9.2 mg/dL (ref 8.6–10.2)
CHLORIDE: 102 mmol/L (ref 96–106)
CO2: 25 mmol/L (ref 20–29)
Creatinine, Ser: 1.33 mg/dL — ABNORMAL HIGH (ref 0.76–1.27)
GFR calc non Af Amer: 49 mL/min/{1.73_m2} — ABNORMAL LOW (ref >59)
GFR, EST AFRICAN AMERICAN: 56 mL/min/{1.73_m2} — AB (ref >59)
Glucose: 90 mg/dL (ref 65–99)
Potassium: 4.2 mmol/L (ref 3.5–5.2)
Sodium: 141 mmol/L (ref 134–144)

## 2017-04-18 LAB — HEPATIC FUNCTION PANEL
ALK PHOS: 73 IU/L (ref 39–117)
ALT: 14 IU/L (ref 0–44)
AST: 20 IU/L (ref 0–40)
Albumin: 4.3 g/dL (ref 3.5–4.7)
BILIRUBIN TOTAL: 0.8 mg/dL (ref 0.0–1.2)
BILIRUBIN, DIRECT: 0.25 mg/dL (ref 0.00–0.40)
Total Protein: 6.6 g/dL (ref 6.0–8.5)

## 2017-04-18 LAB — CBC WITH DIFFERENTIAL/PLATELET
BASOS: 1 %
Basophils Absolute: 0 10*3/uL (ref 0.0–0.2)
EOS (ABSOLUTE): 0.1 10*3/uL (ref 0.0–0.4)
EOS: 2 %
Hematocrit: 40.9 % (ref 37.5–51.0)
Hemoglobin: 14.2 g/dL (ref 13.0–17.7)
IMMATURE GRANS (ABS): 0 10*3/uL (ref 0.0–0.1)
IMMATURE GRANULOCYTES: 0 %
LYMPHS: 18 %
Lymphocytes Absolute: 1 10*3/uL (ref 0.7–3.1)
MCH: 32.3 pg (ref 26.6–33.0)
MCHC: 34.7 g/dL (ref 31.5–35.7)
MCV: 93 fL (ref 79–97)
Monocytes Absolute: 0.5 10*3/uL (ref 0.1–0.9)
Monocytes: 10 %
NEUTROS PCT: 69 %
Neutrophils Absolute: 3.8 10*3/uL (ref 1.4–7.0)
PLATELETS: 186 10*3/uL (ref 150–379)
RBC: 4.39 x10E6/uL (ref 4.14–5.80)
RDW: 13.3 % (ref 12.3–15.4)
WBC: 5.4 10*3/uL (ref 3.4–10.8)

## 2017-04-18 LAB — VITAMIN D 25 HYDROXY (VIT D DEFICIENCY, FRACTURES): VIT D 25 HYDROXY: 42.4 ng/mL (ref 30.0–100.0)

## 2017-04-25 DIAGNOSIS — H04123 Dry eye syndrome of bilateral lacrimal glands: Secondary | ICD-10-CM | POA: Diagnosis not present

## 2017-04-25 DIAGNOSIS — H26493 Other secondary cataract, bilateral: Secondary | ICD-10-CM | POA: Diagnosis not present

## 2017-04-25 DIAGNOSIS — H43393 Other vitreous opacities, bilateral: Secondary | ICD-10-CM | POA: Diagnosis not present

## 2017-04-25 DIAGNOSIS — H43813 Vitreous degeneration, bilateral: Secondary | ICD-10-CM | POA: Diagnosis not present

## 2017-05-21 ENCOUNTER — Other Ambulatory Visit: Payer: Self-pay | Admitting: Family Medicine

## 2017-05-21 ENCOUNTER — Other Ambulatory Visit: Payer: Medicare Other

## 2017-05-21 DIAGNOSIS — Z1211 Encounter for screening for malignant neoplasm of colon: Secondary | ICD-10-CM

## 2017-05-23 LAB — FECAL OCCULT BLOOD, IMMUNOCHEMICAL: Fecal Occult Bld: NEGATIVE

## 2017-05-25 ENCOUNTER — Other Ambulatory Visit: Payer: Self-pay | Admitting: Family Medicine

## 2017-05-31 DIAGNOSIS — H26493 Other secondary cataract, bilateral: Secondary | ICD-10-CM | POA: Diagnosis not present

## 2017-06-20 ENCOUNTER — Telehealth: Payer: Self-pay | Admitting: Family Medicine

## 2017-06-20 ENCOUNTER — Other Ambulatory Visit: Payer: Self-pay | Admitting: Nurse Practitioner

## 2017-06-20 NOTE — Telephone Encounter (Signed)
Use sunscreen and continue current medication.  If the patient starts itching or gets very red above and beyond his current skin condition then we can stop the HCTZ portion of the medicine.  He has had good blood pressure control over the past years and I hate to stop the medicine and then lose control.

## 2017-06-20 NOTE — Telephone Encounter (Signed)
Aware - will try sunscreen

## 2017-06-29 DIAGNOSIS — N183 Chronic kidney disease, stage 3 (moderate): Secondary | ICD-10-CM | POA: Diagnosis not present

## 2017-06-29 DIAGNOSIS — S37011D Minor contusion of right kidney, subsequent encounter: Secondary | ICD-10-CM | POA: Diagnosis not present

## 2017-06-29 DIAGNOSIS — N200 Calculus of kidney: Secondary | ICD-10-CM | POA: Diagnosis not present

## 2017-06-29 DIAGNOSIS — N401 Enlarged prostate with lower urinary tract symptoms: Secondary | ICD-10-CM | POA: Diagnosis not present

## 2017-07-05 ENCOUNTER — Other Ambulatory Visit: Payer: Self-pay | Admitting: Family Medicine

## 2017-07-31 ENCOUNTER — Other Ambulatory Visit: Payer: Self-pay | Admitting: Family Medicine

## 2017-08-02 ENCOUNTER — Other Ambulatory Visit: Payer: Self-pay | Admitting: Family Medicine

## 2017-08-18 ENCOUNTER — Other Ambulatory Visit: Payer: Self-pay | Admitting: Family Medicine

## 2017-08-20 ENCOUNTER — Other Ambulatory Visit: Payer: Self-pay | Admitting: Family Medicine

## 2017-09-10 ENCOUNTER — Other Ambulatory Visit: Payer: Self-pay | Admitting: Family Medicine

## 2017-09-20 ENCOUNTER — Other Ambulatory Visit: Payer: Self-pay | Admitting: Family Medicine

## 2017-10-15 ENCOUNTER — Encounter: Payer: Self-pay | Admitting: Family Medicine

## 2017-10-15 ENCOUNTER — Ambulatory Visit (INDEPENDENT_AMBULATORY_CARE_PROVIDER_SITE_OTHER): Payer: Medicare Other

## 2017-10-15 ENCOUNTER — Ambulatory Visit (INDEPENDENT_AMBULATORY_CARE_PROVIDER_SITE_OTHER): Payer: Medicare Other | Admitting: Family Medicine

## 2017-10-15 VITALS — BP 126/63 | HR 73 | Temp 97.0°F | Ht 68.0 in | Wt 160.0 lb

## 2017-10-15 DIAGNOSIS — R0989 Other specified symptoms and signs involving the circulatory and respiratory systems: Secondary | ICD-10-CM | POA: Diagnosis not present

## 2017-10-15 DIAGNOSIS — N183 Chronic kidney disease, stage 3 unspecified: Secondary | ICD-10-CM

## 2017-10-15 DIAGNOSIS — I10 Essential (primary) hypertension: Secondary | ICD-10-CM

## 2017-10-15 DIAGNOSIS — I700 Atherosclerosis of aorta: Secondary | ICD-10-CM

## 2017-10-15 DIAGNOSIS — E785 Hyperlipidemia, unspecified: Secondary | ICD-10-CM

## 2017-10-15 DIAGNOSIS — E559 Vitamin D deficiency, unspecified: Secondary | ICD-10-CM | POA: Diagnosis not present

## 2017-10-15 DIAGNOSIS — I480 Paroxysmal atrial fibrillation: Secondary | ICD-10-CM | POA: Diagnosis not present

## 2017-10-15 DIAGNOSIS — D696 Thrombocytopenia, unspecified: Secondary | ICD-10-CM

## 2017-10-15 NOTE — Patient Instructions (Addendum)
Medicare Annual Wellness Visit  [facility] and the medical providers at [facility] strive to bring you the best medical care.  In doing so we not only want to address your current medical conditions and concerns but also to detect new conditions early and prevent illness, disease and health-related problems.    Medicare offers a yearly Wellness Visit which allows our clinical staff to assess your need for preventative services including immunizations, lifestyle education, counseling to decrease risk of preventable diseases and screening for fall risk and other medical concerns.    This visit is provided free of charge (no copay) for all Medicare recipients. The clinical pharmacists at [facility] have begun to conduct these Wellness Visits which will also include a thorough review of all your medications.    As you primary medical provider recommend that you make an appointment for your Annual Wellness Visit if you have not done so already this year.  You may set up this appointment before you leave today or you may call back ([phone]) and schedule an appointment.  Please make sure when you call that you mention that you are scheduling your Annual Wellness Visit with the clinical pharmacist so that the appointment may be made for the proper length of time.     Continue current medications. Continue good therapeutic lifestyle changes which include good diet and exercise. Fall precautions discussed with patient. If an FOBT was given today- please return it to our front desk. If you are over 54 years old - you may need Prevnar 80 or the adult Pneumonia vaccine.  **Flu shots are available--- please call and schedule a FLU-CLINIC appointment**  After your visit with [place] today you will receive a survey in the mail or online from [employer] regarding your care with [place]. Please take a moment to fill this out. Your feedback is very  important to [place] as you can help [place] better understand your patient needs as well as improve your experience and satisfaction. WE CARE ABOUT YOU!!!   Continue to follow-up with dermatology and Dr. Lyman Speller for psoriasis Continue to follow-up with Dr. Rosana Hoes, urologist, for BPH Continue to stay active and drink plenty of water and fluids Make all efforts to keep vision checked regularly and hearing checked regularly Continue current meds Monitor blood pressures at home Continue to watch sodium intake Consider visiting the podiatrist, Steffanie Rainwater
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
## 2017-10-16 ENCOUNTER — Encounter: Payer: Self-pay | Admitting: Family Medicine

## 2017-10-16 LAB — LIPID PANEL
CHOL/HDL RATIO: 2.3 ratio (ref 0.0–5.0)
Cholesterol, Total: 125 mg/dL (ref 100–199)
HDL: 55 mg/dL (ref >39)
LDL Calculated: 57 mg/dL (ref 0–99)
TRIGLYCERIDES: 66 mg/dL (ref 0–149)
VLDL CHOLESTEROL CAL: 13 mg/dL (ref 5–40)

## 2017-10-16 LAB — CBC WITH DIFFERENTIAL/PLATELET
Basophils Absolute: 0.1 10*3/uL (ref 0.0–0.2)
Basos: 1 %
EOS (ABSOLUTE): 0.2 10*3/uL (ref 0.0–0.4)
Eos: 3 %
Hematocrit: 41.9 % (ref 37.5–51.0)
Hemoglobin: 14.1 g/dL (ref 13.0–17.7)
IMMATURE GRANS (ABS): 0 10*3/uL (ref 0.0–0.1)
Immature Granulocytes: 0 %
LYMPHS: 20 %
Lymphocytes Absolute: 1 10*3/uL (ref 0.7–3.1)
MCH: 31.6 pg (ref 26.6–33.0)
MCHC: 33.7 g/dL (ref 31.5–35.7)
MCV: 94 fL (ref 79–97)
MONOCYTES: 11 %
Monocytes Absolute: 0.6 10*3/uL (ref 0.1–0.9)
NEUTROS ABS: 3.3 10*3/uL (ref 1.4–7.0)
NEUTROS PCT: 65 %
PLATELETS: 179 10*3/uL (ref 150–450)
RBC: 4.46 x10E6/uL (ref 4.14–5.80)
RDW: 13 % (ref 12.3–15.4)
WBC: 5.2 10*3/uL (ref 3.4–10.8)

## 2017-10-16 LAB — BMP8+EGFR
BUN/Creatinine Ratio: 10 (ref 10–24)
BUN: 14 mg/dL (ref 8–27)
CALCIUM: 8.9 mg/dL (ref 8.6–10.2)
CHLORIDE: 103 mmol/L (ref 96–106)
CO2: 24 mmol/L (ref 20–29)
Creatinine, Ser: 1.46 mg/dL — ABNORMAL HIGH (ref 0.76–1.27)
GFR calc non Af Amer: 44 mL/min/{1.73_m2} — ABNORMAL LOW (ref >59)
GFR, EST AFRICAN AMERICAN: 50 mL/min/{1.73_m2} — AB (ref >59)
GLUCOSE: 94 mg/dL (ref 65–99)
POTASSIUM: 4 mmol/L (ref 3.5–5.2)
Sodium: 142 mmol/L (ref 134–144)

## 2017-10-16 LAB — HEPATIC FUNCTION PANEL
ALK PHOS: 67 IU/L (ref 39–117)
ALT: 14 IU/L (ref 0–44)
AST: 24 IU/L (ref 0–40)
Albumin: 4.5 g/dL (ref 3.5–4.7)
Bilirubin Total: 0.8 mg/dL (ref 0.0–1.2)
Bilirubin, Direct: 0.24 mg/dL (ref 0.00–0.40)
Total Protein: 6.5 g/dL (ref 6.0–8.5)

## 2017-10-16 LAB — VITAMIN D 25 HYDROXY (VIT D DEFICIENCY, FRACTURES): Vit D, 25-Hydroxy: 44.5 ng/mL (ref 30.0–100.0)

## 2017-10-23 ENCOUNTER — Other Ambulatory Visit: Payer: Self-pay | Admitting: Family Medicine

## 2017-10-26 ENCOUNTER — Ambulatory Visit (INDEPENDENT_AMBULATORY_CARE_PROVIDER_SITE_OTHER): Payer: Medicare Other | Admitting: Family Medicine

## 2017-10-26 ENCOUNTER — Encounter: Payer: Self-pay | Admitting: Family Medicine

## 2017-10-26 VITALS — BP 118/61 | HR 80 | Temp 97.0°F | Ht 68.0 in | Wt 161.0 lb

## 2017-10-26 DIAGNOSIS — L02416 Cutaneous abscess of left lower limb: Secondary | ICD-10-CM

## 2017-10-26 DIAGNOSIS — I480 Paroxysmal atrial fibrillation: Secondary | ICD-10-CM | POA: Diagnosis not present

## 2017-10-26 DIAGNOSIS — B078 Other viral warts: Secondary | ICD-10-CM | POA: Diagnosis not present

## 2017-10-26 MED ORDER — CIPROFLOXACIN HCL 250 MG PO TABS: 250.0000 mg | ORAL_TABLET | Freq: Two times a day (BID) | ORAL | 0 refills | Status: DC

## 2017-10-26 MED ORDER — ASPIRIN EC 81 MG PO TBEC: 81.0000 mg | DELAYED_RELEASE_TABLET | Freq: Every day | ORAL | Status: DC

## 2017-10-26 NOTE — Progress Notes (Signed)
Chief Complaint  Patient presents with  . blister in groin area    Looks like its filled with blood    HPI  Patient presents today for a lesion at the left groin region.  He says he just noticed it yesterday.  It is bulging from the groin crease on the left and looks like there is blood in it.  He denies pain and he denies injury.  He is never had anything like this before.  He denies any fever chills or sweats.  No pus draining.  Additionally patient has a small wart on the right forearm that he would like to have frozen today.  PMH: Smoking status noted ROS: Per HPI  Objective: BP 118/61   Pulse 80   Temp (!) 97 F (36.1 C) (Oral)   Ht 5\' 8"  (1.727 m)   Wt 161 lb (73 kg)   BMI 24.48 kg/m  Gen: NAD, alert, cooperative with exam HEENT: NCAT, EOMI, PERRL Skin: The left groin lesion is 1 cm with a blistered roof.  It has a 3 cm area of erythema surrounding.  This was easily unroofed while inspecting the lesion.  A few drops of mucopurulent sanguinous fluid drained.  After cleansing with hydrogen peroxide there remained a granulomatous bed with mildly erythematous region surrounding.  This was covered with a simple Band-Aid dressing and Neosporin.  There is a 3 mm wart with a 1 mm raised papule extending from it at the dorsal right forearm just above the wrist.  This was treated for 20 seconds with liquid nitrogen cryotherapy no complications noted. Neuro: Alert and oriented, No gross deficits  Assessment and plan:  1. Common wart   2. Cutaneous abscess of left lower extremity   3. PAF (paroxysmal atrial fibrillation) ([place])     Meds ordered this encounter  Medications  . aspirin EC 81 MG tablet    Sig: Take 1 tablet (81 mg total) by mouth daily.  . ciprofloxacin (CIPRO) 250 MG tablet    Sig: Take 1 tablet (250 mg total) by mouth 2 (two) times daily.    Dispense:  20 tablet    Refill:  0    No orders of the defined types were placed in this encounter.   Follow up as  needed.  Claretta Fraise, MD

## 2017-10-31 ENCOUNTER — Encounter: Payer: Self-pay | Admitting: *Deleted

## 2017-10-31 ENCOUNTER — Ambulatory Visit (INDEPENDENT_AMBULATORY_CARE_PROVIDER_SITE_OTHER): Payer: Medicare Other | Admitting: *Deleted

## 2017-10-31 VITALS — BP 143/64 | HR 72 | Ht 68.0 in | Wt 161.0 lb

## 2017-10-31 DIAGNOSIS — Z0000 Encounter for general adult medical examination without abnormal findings: Secondary | ICD-10-CM | POA: Diagnosis not present

## 2017-10-31 NOTE — Patient Instructions (Addendum)
Please work on your goal of exercising 3 times per week for 30 minutes.   At your convenience, please bring a copy of your Advance Directives to our office to be filed in your medical record.   Please consider getting the new Shingles vaccine in the future.   Follow up with Dr. Laurance Flatten as directed.  Thank you for coming in for your Annual Wellness Visit today!   Preventive Care 46 Years and Older, Male Preventive care refers to lifestyle choices and visits with your health care provider that can promote health and wellness. What does preventive care include?  A yearly physical exam. This is also called an annual well check.  Dental exams once or twice a year.  Routine eye exams. Ask your health care provider how often you should have your eyes checked.  Personal lifestyle choices, including: ? Daily care of your teeth and gums. ? Regular physical activity. ? Eating a healthy diet. ? Avoiding tobacco and drug use. ? Limiting alcohol use. ? Practicing safe sex. ? Taking low doses of aspirin every day. ? Taking vitamin and mineral supplements as recommended by your health care provider. What happens during an annual well check? The services and screenings done by your health care provider during your annual well check will depend on your age, overall health, lifestyle risk factors, and family history of disease. Counseling Your health care provider may ask you questions about your:  Alcohol use.  Tobacco use.  Drug use.  Emotional well-being.  Home and relationship well-being.  Sexual activity.  Eating habits.  History of falls.  Memory and ability to understand (cognition).  Work and work Statistician.  Screening You may have the following tests or measurements:  Height, weight, and BMI.  Blood pressure.  Lipid and cholesterol levels. These may be checked every 5 years, or more frequently if you are over 83 years old.  Skin check.  Lung cancer screening.  You may have this screening every year starting at age 61 if you have a 30-pack-year history of smoking and currently smoke or have quit within the past 15 years.  Fecal occult blood test (FOBT) of the stool. You may have this test every year starting at age 68.  Flexible sigmoidoscopy or colonoscopy. You may have a sigmoidoscopy every 5 years or a colonoscopy every 10 years starting at age 66.  Prostate cancer screening. Recommendations will vary depending on your family history and other risks.  Hepatitis C blood test.  Hepatitis B blood test.  Sexually transmitted disease (STD) testing.  Diabetes screening. This is done by checking your blood sugar (glucose) after you have not eaten for a while (fasting). You may have this done every 1-3 years.  Abdominal aortic aneurysm (AAA) screening. You may need this if you are a current or former smoker.  Osteoporosis. You may be screened starting at age 69 if you are at high risk.  Talk with your health care provider about your test results, treatment options, and if necessary, the need for more tests. Vaccines Your health care provider may recommend certain vaccines, such as:  Influenza vaccine. This is recommended every year.  Tetanus, diphtheria, and acellular pertussis (Tdap, Td) vaccine. You may need a Td booster every 10 years.  Varicella vaccine. You may need this if you have not been vaccinated.  Zoster vaccine. You may need this after age 58.  Measles, mumps, and rubella (MMR) vaccine. You may need at least one dose of MMR if you were  born in [birth year] or later. You may also need a second dose.  Pneumococcal 13-valent conjugate (PCV13) vaccine. One dose is recommended after age 36.  Pneumococcal polysaccharide (PPSV23) vaccine. One dose is recommended after age 17.  Meningococcal vaccine. You may need this if you have certain conditions.  Hepatitis A vaccine. You may need this if you have certain conditions or if you travel or  work in places where you may be exposed to hepatitis A.  Hepatitis B vaccine. You may need this if you have certain conditions or if you travel or work in places where you may be exposed to hepatitis B.  Haemophilus influenzae type b (Hib) vaccine. You may need this if you have certain risk factors.  Talk to your health care provider about which screenings and vaccines you need and how often you need them. This information is not intended to replace advice given to you by your health care provider. Make sure you discuss any questions you have with your health care provider. Document Released: 03/05/2015 Document Revised: 10/27/2015 Document Reviewed: 12/08/2014 Elsevier Interactive Patient Education  [employer].

## 2017-11-01 DIAGNOSIS — M79676 Pain in unspecified toe(s): Secondary | ICD-10-CM | POA: Diagnosis not present

## 2017-11-01 DIAGNOSIS — B351 Tinea unguium: Secondary | ICD-10-CM | POA: Diagnosis not present
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
## 2017-11-19 ENCOUNTER — Other Ambulatory Visit: Payer: Self-pay | Admitting: Family Medicine

## 2017-11-28 ENCOUNTER — Ambulatory Visit (INDEPENDENT_AMBULATORY_CARE_PROVIDER_SITE_OTHER): Payer: Medicare Other | Admitting: *Deleted

## 2017-11-28 DIAGNOSIS — Z23 Encounter for immunization: Secondary | ICD-10-CM

## 2017-12-01 ENCOUNTER — Other Ambulatory Visit: Payer: Self-pay

## 2017-12-01 ENCOUNTER — Emergency Department (HOSPITAL_COMMUNITY): Payer: Medicare Other

## 2017-12-01 ENCOUNTER — Emergency Department (HOSPITAL_COMMUNITY)
Admission: EM | Admit: 2017-12-01 | Discharge: 2017-12-01 | Disposition: A | Discharge status: Home / Self Care | Payer: Medicare Other | Attending: Emergency Medicine | Admitting: Emergency Medicine

## 2017-12-01 ENCOUNTER — Encounter (HOSPITAL_COMMUNITY): Payer: Self-pay | Admitting: Emergency Medicine

## 2017-12-01 DIAGNOSIS — Z79899 Other long term (current) drug therapy: Secondary | ICD-10-CM | POA: Insufficient documentation

## 2017-12-01 DIAGNOSIS — K8020 Calculus of gallbladder without cholecystitis without obstruction: Secondary | ICD-10-CM | POA: Diagnosis not present

## 2017-12-01 DIAGNOSIS — Z7982 Long term (current) use of aspirin: Secondary | ICD-10-CM | POA: Diagnosis not present

## 2017-12-01 DIAGNOSIS — K5730 Diverticulosis of large intestine without perforation or abscess without bleeding: Secondary | ICD-10-CM | POA: Insufficient documentation

## 2017-12-01 DIAGNOSIS — I2510 Atherosclerotic heart disease of native coronary artery without angina pectoris: Secondary | ICD-10-CM | POA: Insufficient documentation

## 2017-12-01 DIAGNOSIS — N189 Chronic kidney disease, unspecified: Secondary | ICD-10-CM | POA: Diagnosis not present

## 2017-12-01 DIAGNOSIS — K625 Hemorrhage of anus and rectum: Secondary | ICD-10-CM | POA: Diagnosis not present

## 2017-12-01 DIAGNOSIS — N183 Chronic kidney disease, stage 3 (moderate): Secondary | ICD-10-CM | POA: Insufficient documentation

## 2017-12-01 DIAGNOSIS — Z87891 Personal history of nicotine dependence: Secondary | ICD-10-CM | POA: Diagnosis not present

## 2017-12-01 DIAGNOSIS — I129 Hypertensive chronic kidney disease with stage 1 through stage 4 chronic kidney disease, or unspecified chronic kidney disease: Secondary | ICD-10-CM | POA: Insufficient documentation

## 2017-12-01 DIAGNOSIS — K5790 Diverticulosis of intestine, part unspecified, without perforation or abscess without bleeding: Secondary | ICD-10-CM

## 2017-12-01 LAB — CBC WITH DIFFERENTIAL/PLATELET
ABS IMMATURE GRANULOCYTES: 0.01 10*3/uL (ref 0.00–0.07)
BASOS PCT: 1 %
Basophils Absolute: 0 10*3/uL (ref 0.0–0.1)
EOS ABS: 0.1 10*3/uL (ref 0.0–0.5)
Eosinophils Relative: 1 %
HCT: 43.1 % (ref 39.0–52.0)
Hemoglobin: 14.2 g/dL (ref 13.0–17.0)
Immature Granulocytes: 0 %
Lymphocytes Relative: 10 %
Lymphs Abs: 0.6 10*3/uL — ABNORMAL LOW (ref 0.7–4.0)
MCH: 31.9 pg (ref 26.0–34.0)
MCHC: 32.9 g/dL (ref 30.0–36.0)
MCV: 96.9 fL (ref 80.0–100.0)
MONO ABS: 0.5 10*3/uL (ref 0.1–1.0)
MONOS PCT: 8 %
NEUTROS ABS: 5 10*3/uL (ref 1.7–7.7)
Neutrophils Relative %: 80 %
PLATELETS: 156 10*3/uL (ref 150–400)
RBC: 4.45 MIL/uL (ref 4.22–5.81)
RDW: 13.1 % (ref 11.5–15.5)
WBC: 6.3 10*3/uL (ref 4.0–10.5)
nRBC: 0 % (ref 0.0–0.2)

## 2017-12-01 LAB — COMPREHENSIVE METABOLIC PANEL
ALT: 21 U/L (ref 0–44)
AST: 20 U/L (ref 15–41)
Albumin: 3.8 g/dL (ref 3.5–5.0)
Alkaline Phosphatase: 56 U/L (ref 38–126)
Anion gap: 9 (ref 5–15)
BUN: 23 mg/dL (ref 8–23)
CHLORIDE: 106 mmol/L (ref 98–111)
CO2: 26 mmol/L (ref 22–32)
CREATININE: 1.46 mg/dL — AB (ref 0.61–1.24)
Calcium: 8.9 mg/dL (ref 8.9–10.3)
GFR, EST AFRICAN AMERICAN: 49 mL/min — AB (ref >60)
GFR, EST NON AFRICAN AMERICAN: 42 mL/min — AB (ref >60)
Glucose, Bld: 105 mg/dL — ABNORMAL HIGH (ref 70–99)
Potassium: 4 mmol/L (ref 3.5–5.1)
SODIUM: 141 mmol/L (ref 135–145)
Total Bilirubin: 1.2 mg/dL (ref 0.3–1.2)
Total Protein: 6.7 g/dL (ref 6.5–8.1)

## 2017-12-01 LAB — TYPE AND SCREEN
ABO/RH(D): O POS
ANTIBODY SCREEN: NEGATIVE

## 2017-12-01 LAB — POC OCCULT BLOOD, ED: FECAL OCCULT BLD: POSITIVE — AB

## 2017-12-01 MED ORDER — CIPROFLOXACIN HCL 250 MG PO TABS
500.0000 mg | ORAL_TABLET | Freq: Once | ORAL | Status: AC
  Administered 2017-12-01: 500 mg via ORAL
  Filled 2017-12-01: qty 2

## 2017-12-01 MED ORDER — METRONIDAZOLE 500 MG PO TABS: 500.0000 mg | ORAL_TABLET | Freq: Four times a day (QID) | ORAL | 0 refills | Status: DC

## 2017-12-01 MED ORDER — METRONIDAZOLE 500 MG PO TABS
500.0000 mg | ORAL_TABLET | Freq: Once | ORAL | Status: AC
  Administered 2017-12-01: 500 mg via ORAL
  Filled 2017-12-01: qty 1

## 2017-12-01 MED ORDER — SODIUM CHLORIDE 0.9 % IV SOLN
Freq: Once | INTRAVENOUS | Status: AC
  Administered 2017-12-01: 11:00:00 via INTRAVENOUS

## 2017-12-01 MED ORDER — SULFAMETHOXAZOLE-TRIMETHOPRIM 800-160 MG PO TABS: 1.0000 | ORAL_TABLET | Freq: Two times a day (BID) | ORAL | 0 refills | Status: AC

## 2017-12-01 MED ORDER — IOPAMIDOL (ISOVUE-300) INJECTION 61%
100.0000 mL | Freq: Once | INTRAVENOUS | Status: AC | PRN
  Administered 2017-12-01: 100 mL via INTRAVENOUS

## 2017-12-01 NOTE — ED Triage Notes (Signed)
Pt reports having a solid bowel movement last night and then getting up at 5am to have a loose bowel movement and then again later in the morning which was very loose with blood with wiping.  No blood noticed in toilet.  Pt denies any abd pain.
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
## 2017-12-01 NOTE — Discharge Instructions (Addendum)
CT scan shows diverticulosis and possible early diverticulitis.  Additionally, your kidney function is slightly abnormal (creatinine). Prescription for 2 antibiotics.  Follow-up with your gastroenterologist early next week.  Return for increased bleeding, increased pain, fever, any worsening condition.

## 2017-12-05 ENCOUNTER — Telehealth: Payer: Self-pay | Admitting: Family Medicine

## 2017-12-05 ENCOUNTER — Encounter: Payer: Self-pay | Admitting: Family Medicine

## 2017-12-05 ENCOUNTER — Ambulatory Visit (INDEPENDENT_AMBULATORY_CARE_PROVIDER_SITE_OTHER): Payer: Medicare Other | Admitting: Family Medicine

## 2017-12-05 VITALS — BP 117/53 | HR 86 | Temp 97.2°F | Ht 68.0 in | Wt 159.0 lb

## 2017-12-05 DIAGNOSIS — K5792 Diverticulitis of intestine, part unspecified, without perforation or abscess without bleeding: Secondary | ICD-10-CM

## 2017-12-05 DIAGNOSIS — K921 Melena: Secondary | ICD-10-CM

## 2017-12-05 DIAGNOSIS — T50905A Adverse effect of unspecified drugs, medicaments and biological substances, initial encounter: Secondary | ICD-10-CM
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
## 2017-12-05 NOTE — Patient Instructions (Addendum)
Resume taking antibiotics Drink plenty of water Take the antibiotics with food Call back in 2 days and if absolutely cannot tolerate antibiotics we will discuss this with gastroenterologist to make a decision about what to switch you to at that time Reduce caffeine intake Eat more bland foods and soups We will call with the results of the CBC as soon as that result becomes available

## 2017-12-05 NOTE — Telephone Encounter (Signed)
Patient has a follow up appointment scheduled. 

## 2017-12-06 LAB — CBC WITH DIFFERENTIAL/PLATELET
BASOS ABS: 0 10*3/uL (ref 0.0–0.2)
BASOS: 1 %
EOS (ABSOLUTE): 0 10*3/uL (ref 0.0–0.4)
Eos: 1 %
HEMATOCRIT: 42.7 % (ref 37.5–51.0)
HEMOGLOBIN: 14.6 g/dL (ref 13.0–17.7)
IMMATURE GRANS (ABS): 0 10*3/uL (ref 0.0–0.1)
Immature Granulocytes: 0 %
LYMPHS ABS: 1 10*3/uL (ref 0.7–3.1)
LYMPHS: 16 %
MCH: 31.7 pg (ref 26.6–33.0)
MCHC: 34.2 g/dL (ref 31.5–35.7)
MCV: 93 fL (ref 79–97)
MONOCYTES: 11 %
Monocytes Absolute: 0.7 10*3/uL (ref 0.1–0.9)
NEUTROS ABS: 4.2 10*3/uL (ref 1.4–7.0)
Neutrophils: 71 %
Platelets: 208 10*3/uL (ref 150–450)
RBC: 4.61 x10E6/uL (ref 4.14–5.80)
RDW: 12.8 % (ref 12.3–15.4)
WBC: 5.9 10*3/uL (ref 3.4–10.8)

## 2017-12-07 ENCOUNTER — Telehealth: Payer: Self-pay | Admitting: Family Medicine

## 2017-12-07 NOTE — Telephone Encounter (Signed)
Pt aware of recommendation °

## 2017-12-07 NOTE — Telephone Encounter (Signed)
Stay on Cipro as the pharmacist has suggested and discontinue the metronidazole and follow-up with Dr. Liliane Channel office as planned next week

## 2017-12-07 NOTE — Telephone Encounter (Signed)
Dup note  

## 2017-12-07 NOTE — Telephone Encounter (Signed)
Pt seen on WED - he calls back today to check in and states that he still feels awful. He tried taking the 2 meds with food - did not help. He spoke with pharmacist and they recommend him stop the flagyl. He is still on the Cipro currently. He called Dr Hoyle Sauer office and they have him an appt for next WED at 10 am. He is not hungry and id making himself eat. He still has not seen any more blood.

## 2017-12-12 DIAGNOSIS — Z86010 Personal history of colonic polyps: Secondary | ICD-10-CM | POA: Insufficient documentation

## 2017-12-12 DIAGNOSIS — K625 Hemorrhage of anus and rectum: Secondary | ICD-10-CM | POA: Diagnosis not present

## 2017-12-12 DIAGNOSIS — Z860101 Personal history of adenomatous and serrated colon polyps: Secondary | ICD-10-CM | POA: Insufficient documentation

## 2017-12-12 DIAGNOSIS — K5790 Diverticulosis of intestine, part unspecified, without perforation or abscess without bleeding: Secondary | ICD-10-CM | POA: Insufficient documentation

## 2017-12-12 DIAGNOSIS — K648 Other hemorrhoids: Secondary | ICD-10-CM | POA: Insufficient documentation

## 2017-12-17 ENCOUNTER — Other Ambulatory Visit: Payer: Self-pay | Admitting: Family Medicine

## 2017-12-31 ENCOUNTER — Other Ambulatory Visit: Payer: Self-pay | Admitting: Family Medicine

## 2018-02-11 ENCOUNTER — Other Ambulatory Visit: Payer: Self-pay | Admitting: Family Medicine

## 2018-02-19 ENCOUNTER — Other Ambulatory Visit: Payer: Self-pay | Admitting: Family Medicine

## 2018-02-25 ENCOUNTER — Encounter: Payer: Self-pay | Admitting: Family Medicine

## 2018-02-25 DIAGNOSIS — K648 Other hemorrhoids: Secondary | ICD-10-CM | POA: Diagnosis not present

## 2018-02-25 DIAGNOSIS — Z87891 Personal history of nicotine dependence: Secondary | ICD-10-CM | POA: Diagnosis not present

## 2018-02-25 DIAGNOSIS — Z86010 Personal history of colonic polyps: Secondary | ICD-10-CM | POA: Diagnosis not present

## 2018-02-25 DIAGNOSIS — Z1211 Encounter for screening for malignant neoplasm of colon: Secondary | ICD-10-CM | POA: Diagnosis not present

## 2018-02-25 DIAGNOSIS — K5730 Diverticulosis of large intestine without perforation or abscess without bleeding: Secondary | ICD-10-CM | POA: Diagnosis not present

## 2018-02-25 DIAGNOSIS — K625 Hemorrhage of anus and rectum: Secondary | ICD-10-CM | POA: Diagnosis not present

## 2018-02-26 ENCOUNTER — Other Ambulatory Visit: Payer: Self-pay | Admitting: Family Medicine

## 2018-02-26 MED ORDER — FLUOCINONIDE 0.05 % EX SOLN: CUTANEOUS | 2 refills | Status: DC

## 2018-03-05 ENCOUNTER — Encounter: Payer: Self-pay | Admitting: Family Medicine

## 2018-03-05 ENCOUNTER — Ambulatory Visit (INDEPENDENT_AMBULATORY_CARE_PROVIDER_SITE_OTHER): Payer: Medicare Other | Admitting: Family Medicine

## 2018-03-05 VITALS — BP 114/59 | HR 78 | Temp 97.1°F | Ht 68.0 in | Wt 155.0 lb

## 2018-03-05 DIAGNOSIS — I700 Atherosclerosis of aorta: Secondary | ICD-10-CM

## 2018-03-05 DIAGNOSIS — N183 Chronic kidney disease, stage 3 unspecified: Secondary | ICD-10-CM

## 2018-03-05 DIAGNOSIS — M109 Gout, unspecified: Secondary | ICD-10-CM

## 2018-03-05 DIAGNOSIS — E785 Hyperlipidemia, unspecified: Secondary | ICD-10-CM | POA: Diagnosis not present

## 2018-03-05 DIAGNOSIS — I480 Paroxysmal atrial fibrillation: Secondary | ICD-10-CM | POA: Diagnosis not present

## 2018-03-05 DIAGNOSIS — E559 Vitamin D deficiency, unspecified: Secondary | ICD-10-CM | POA: Diagnosis not present

## 2018-03-05 DIAGNOSIS — D696 Thrombocytopenia, unspecified: Secondary | ICD-10-CM

## 2018-03-05 DIAGNOSIS — I10 Essential (primary) hypertension: Secondary | ICD-10-CM

## 2018-03-05 NOTE — Addendum Note (Signed)
Addended by: Zannie Cove on: 03/05/2018 09:12 AM   Modules accepted: Orders
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
## 2018-03-05 NOTE — Patient Instructions (Addendum)
Medicare Annual Wellness Visit  [facility] and the medical providers at [facility] strive to bring you the best medical care.  In doing so we not only want to address your current medical conditions and concerns but also to detect new conditions early and prevent illness, disease and health-related problems.    Medicare offers a yearly Wellness Visit which allows our clinical staff to assess your need for preventative services including immunizations, lifestyle education, counseling to decrease risk of preventable diseases and screening for fall risk and other medical concerns.    This visit is provided free of charge (no copay) for all Medicare recipients. The clinical pharmacists at [facility] have begun to conduct these Wellness Visits which will also include a thorough review of all your medications.    As you primary medical provider recommend that you make an appointment for your Annual Wellness Visit if you have not done so already this year.  You may set up this appointment before you leave today or you may call back ([phone]) and schedule an appointment.  Please make sure when you call that you mention that you are scheduling your Annual Wellness Visit with the clinical pharmacist so that the appointment may be made for the proper length of time.     Continue current medications. Continue good therapeutic lifestyle changes which include good diet and exercise. Fall precautions discussed with patient. If an FOBT was given today- please return it to our front desk. If you are over 29 years old - you may need Prevnar 83 or the adult Pneumonia vaccine.  **Flu shots are available--- please call and schedule a FLU-CLINIC appointment**  After your visit with [place] today you will receive a survey in the mail or online from [employer] regarding your care with [place]. Please take a moment to fill this out. Your feedback is very  important to [place] as you can help [place] better understand your patient needs as well as improve your experience and satisfaction. WE CARE ABOUT YOU!!!   Continue to follow-up with urology and dermatology.  Cardiology only if needed. Continue to drink plenty of fluids and stay well-hydrated Use hand sanitizer's regularly to avoid getting the flu.

## 2018-03-06 LAB — HEPATIC FUNCTION PANEL
ALT: 17 IU/L (ref 0–44)
AST: 18 IU/L (ref 0–40)
Albumin: 4.1 g/dL (ref 3.5–4.7)
Alkaline Phosphatase: 85 IU/L (ref 39–117)
Bilirubin Total: 0.8 mg/dL (ref 0.0–1.2)
Bilirubin, Direct: 0.25 mg/dL (ref 0.00–0.40)
TOTAL PROTEIN: 6.3 g/dL (ref 6.0–8.5)

## 2018-03-06 LAB — LIPID PANEL
CHOL/HDL RATIO: 2.3 ratio (ref 0.0–5.0)
Cholesterol, Total: 135 mg/dL (ref 100–199)
HDL: 58 mg/dL (ref >39)
LDL Calculated: 67 mg/dL (ref 0–99)
TRIGLYCERIDES: 49 mg/dL (ref 0–149)
VLDL Cholesterol Cal: 10 mg/dL (ref 5–40)

## 2018-03-06 LAB — CBC WITH DIFFERENTIAL/PLATELET
Basophils Absolute: 0.1 10*3/uL (ref 0.0–0.2)
Basos: 1 %
EOS (ABSOLUTE): 0.4 10*3/uL (ref 0.0–0.4)
Eos: 7 %
HEMATOCRIT: 38.7 % (ref 37.5–51.0)
HEMOGLOBIN: 13.8 g/dL (ref 13.0–17.7)
Immature Grans (Abs): 0 10*3/uL (ref 0.0–0.1)
Immature Granulocytes: 0 %
LYMPHS ABS: 0.9 10*3/uL (ref 0.7–3.1)
Lymphs: 14 %
MCH: 32 pg (ref 26.6–33.0)
MCHC: 35.7 g/dL (ref 31.5–35.7)
MCV: 90 fL (ref 79–97)
MONOCYTES: 12 %
MONOS ABS: 0.7 10*3/uL (ref 0.1–0.9)
NEUTROS ABS: 4 10*3/uL (ref 1.4–7.0)
Neutrophils: 66 %
Platelets: 194 10*3/uL (ref 150–450)
RBC: 4.31 x10E6/uL (ref 4.14–5.80)
RDW: 11.7 % (ref 11.6–15.4)
WBC: 6.2 10*3/uL (ref 3.4–10.8)

## 2018-03-06 LAB — BMP8+EGFR
BUN/Creatinine Ratio: 12 (ref 10–24)
BUN: 17 mg/dL (ref 8–27)
CALCIUM: 9.2 mg/dL (ref 8.6–10.2)
CHLORIDE: 100 mmol/L (ref 96–106)
CO2: 24 mmol/L (ref 20–29)
CREATININE: 1.44 mg/dL — AB (ref 0.76–1.27)
GFR calc Af Amer: 51 mL/min/{1.73_m2} — ABNORMAL LOW (ref >59)
GFR, EST NON AFRICAN AMERICAN: 44 mL/min/{1.73_m2} — AB (ref >59)
Glucose: 100 mg/dL — ABNORMAL HIGH (ref 65–99)
Potassium: 4.3 mmol/L (ref 3.5–5.2)
SODIUM: 140 mmol/L (ref 134–144)

## 2018-03-06 LAB — VITAMIN D 25 HYDROXY (VIT D DEFICIENCY, FRACTURES): VIT D 25 HYDROXY: 40.1 ng/mL (ref 30.0–100.0)

## 2018-03-06 LAB — URIC ACID: Uric Acid: 7 mg/dL (ref 3.7–8.6)

## 2018-03-18 ENCOUNTER — Other Ambulatory Visit: Payer: Self-pay | Admitting: Family Medicine

## 2018-04-01 ENCOUNTER — Other Ambulatory Visit: Payer: Self-pay | Admitting: Nurse Practitioner

## 2018-04-22 ENCOUNTER — Other Ambulatory Visit: Payer: Self-pay | Admitting: Family Medicine

## 2018-05-09 ENCOUNTER — Other Ambulatory Visit: Payer: Self-pay | Admitting: Family Medicine

## 2018-05-15 ENCOUNTER — Other Ambulatory Visit: Payer: Self-pay | Admitting: Family Medicine

## 2018-07-08 ENCOUNTER — Other Ambulatory Visit: Payer: Self-pay | Admitting: Family Medicine

## 2018-07-19 ENCOUNTER — Ambulatory Visit: Payer: Medicare Other | Admitting: Family Medicine

## 2018-08-15 ENCOUNTER — Other Ambulatory Visit: Payer: Self-pay | Admitting: Family Medicine

## 2018-09-16 ENCOUNTER — Other Ambulatory Visit: Payer: Self-pay | Admitting: Family Medicine

## 2018-09-16 MED ORDER — CLOBETASOL PROPIONATE 0.05 % EX OINT: TOPICAL_OINTMENT | CUTANEOUS | 0 refills | Status: DC

## 2018-09-16 NOTE — Telephone Encounter (Signed)
Please have patient make appointment to see his next provider.  He was to be seen in May and was not.

## 2018-09-16 NOTE — Telephone Encounter (Signed)
Left detailed message per dpr  

## 2018-09-16 NOTE — Telephone Encounter (Signed)
What is the name of the medication? Clobetasol Ointment 0.5%  Have you contacted your pharmacy to request a refill? Yes  Which pharmacy would you like this sent to? [facility]   Patient notified that their request is being sent to the clinical staff for review and that they should receive a call once it is complete. If they do not receive a call within 24 hours they can check with their pharmacy or our office.

## 2018-09-20 ENCOUNTER — Other Ambulatory Visit: Payer: Self-pay | Admitting: *Deleted

## 2018-09-20 MED ORDER — OLMESARTAN MEDOXOMIL-HCTZ 40-25 MG PO TABS: 1.0000 | ORAL_TABLET | Freq: Every day | ORAL | 0 refills | Status: DC

## 2018-09-20 MED ORDER — CLOBETASOL PROPIONATE 0.05 % EX OINT: TOPICAL_OINTMENT | CUTANEOUS | 0 refills | Status: DC

## 2018-09-25 ENCOUNTER — Other Ambulatory Visit: Payer: Self-pay | Admitting: *Deleted

## 2018-09-25 MED ORDER — FLUOCINONIDE 0.05 % EX SOLN: CUTANEOUS | 2 refills | Status: DC

## 2018-10-01 ENCOUNTER — Other Ambulatory Visit: Payer: Self-pay

## 2018-10-02 ENCOUNTER — Encounter: Payer: Self-pay | Admitting: Family Medicine

## 2018-10-02 ENCOUNTER — Ambulatory Visit (INDEPENDENT_AMBULATORY_CARE_PROVIDER_SITE_OTHER): Payer: Medicare Other | Admitting: Family Medicine

## 2018-10-02 ENCOUNTER — Ambulatory Visit: Payer: Medicare Other | Admitting: Family Medicine

## 2018-10-02 VITALS — BP 113/59 | HR 77 | Temp 98.0°F | Ht 68.0 in | Wt 152.0 lb

## 2018-10-02 DIAGNOSIS — R351 Nocturia: Secondary | ICD-10-CM

## 2018-10-02 DIAGNOSIS — N183 Chronic kidney disease, stage 3 unspecified: Secondary | ICD-10-CM

## 2018-10-02 DIAGNOSIS — E559 Vitamin D deficiency, unspecified: Secondary | ICD-10-CM | POA: Diagnosis not present

## 2018-10-02 DIAGNOSIS — I129 Hypertensive chronic kidney disease with stage 1 through stage 4 chronic kidney disease, or unspecified chronic kidney disease: Secondary | ICD-10-CM

## 2018-10-02 DIAGNOSIS — I480 Paroxysmal atrial fibrillation: Secondary | ICD-10-CM

## 2018-10-02 DIAGNOSIS — D696 Thrombocytopenia, unspecified: Secondary | ICD-10-CM

## 2018-10-02 DIAGNOSIS — I700 Atherosclerosis of aorta: Secondary | ICD-10-CM | POA: Diagnosis not present

## 2018-10-02 DIAGNOSIS — N401 Enlarged prostate with lower urinary tract symptoms: Secondary | ICD-10-CM

## 2018-10-02 DIAGNOSIS — E785 Hyperlipidemia, unspecified: Secondary | ICD-10-CM | POA: Diagnosis not present

## 2018-10-02 MED ORDER — ATORVASTATIN CALCIUM 20 MG PO TABS: 20.0000 mg | ORAL_TABLET | Freq: Every day | ORAL | 3 refills | Status: DC

## 2018-10-02 MED ORDER — FINASTERIDE 5 MG PO TABS: 5.0000 mg | ORAL_TABLET | Freq: Every day | ORAL | 3 refills | Status: DC

## 2018-10-02 MED ORDER — AMLODIPINE BESYLATE 2.5 MG PO TABS: 2.5000 mg | ORAL_TABLET | Freq: Every day | ORAL | 3 refills | Status: DC

## 2018-10-02 MED ORDER — CLOBETASOL PROPIONATE 0.05 % EX OINT: TOPICAL_OINTMENT | CUTANEOUS | 1 refills | Status: DC

## 2018-10-02 MED ORDER — OLMESARTAN MEDOXOMIL-HCTZ 40-25 MG PO TABS: 1.0000 | ORAL_TABLET | Freq: Every day | ORAL | 3 refills | Status: DC
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
## 2018-10-02 NOTE — Patient Instructions (Signed)
DASH Eating Plan DASH stands for "Dietary Approaches to Stop Hypertension." The DASH eating plan is a healthy eating plan that has been shown to reduce high blood pressure (hypertension). Additional health benefits may include reducing the risk of type 2 diabetes mellitus, heart disease, and stroke. The DASH eating plan may also help with weight loss.  WHAT DO I NEED TO KNOW ABOUT THE DASH EATING PLAN? For the DASH eating plan, you will follow these general guidelines:  Choose foods with a percent daily value for sodium of less than 5% (as listed on the food label).  Use salt-free seasonings or herbs instead of table salt or sea salt.  Check with your health care provider or pharmacist before using salt substitutes.  Eat lower-sodium products, often labeled as "lower sodium" or "no salt added."  Eat fresh foods.  Eat more vegetables, fruits, and low-fat dairy products.  Choose whole grains. Look for the word "whole" as the first word in the ingredient list.  Choose fish and skinless chicken or turkey more often than red meat. Limit fish, poultry, and meat to 6 oz (170 g) each day.  Limit sweets, desserts, sugars, and sugary drinks.  Choose heart-healthy fats.  Limit cheese to 1 oz (28 g) per day.  Eat more home-cooked food and less restaurant, buffet, and fast food.  Limit fried foods.  Cook foods using methods other than frying.  Limit canned vegetables. If you do use them, rinse them well to decrease the sodium.  When eating at a restaurant, ask that your food be prepared with less salt, or no salt if possible.  WHAT FOODS CAN I EAT? Seek help from a dietitian for individual calorie needs.  Grains Whole grain or whole wheat bread. Brown rice. Whole grain or whole wheat pasta. Quinoa, bulgur, and whole grain cereals. Low-sodium cereals. Corn or whole wheat flour tortillas. Whole grain cornbread. Whole grain crackers. Low-sodium crackers.  Vegetables Fresh or frozen  vegetables (raw, steamed, roasted, or grilled). Low-sodium or reduced-sodium tomato and vegetable juices. Low-sodium or reduced-sodium tomato sauce and paste. Low-sodium or reduced-sodium canned vegetables.   Fruits All fresh, canned (in natural juice), or frozen fruits.  Meat and Other Protein Products Ground beef (85% or leaner), grass-fed beef, or beef trimmed of fat. Skinless chicken or turkey. Ground chicken or turkey. Pork trimmed of fat. All fish and seafood. Eggs. Dried beans, peas, or lentils. Unsalted nuts and seeds. Unsalted canned beans.  Dairy Low-fat dairy products, such as skim or 1% milk, 2% or reduced-fat cheeses, low-fat ricotta or cottage cheese, or plain low-fat yogurt. Low-sodium or reduced-sodium cheeses.  Fats and Oils Tub margarines without trans fats. Light or reduced-fat mayonnaise and salad dressings (reduced sodium). Avocado. Safflower, olive, or canola oils. Natural peanut or almond butter.  Other Unsalted popcorn and pretzels. The items listed above may not be a complete list of recommended foods or beverages. Contact your dietitian for more options.  WHAT FOODS ARE NOT RECOMMENDED?  Grains White bread. White pasta. White rice. Refined cornbread. Bagels and croissants. Crackers that contain trans fat.  Vegetables Creamed or fried vegetables. Vegetables in a cheese sauce. Regular canned vegetables. Regular canned tomato sauce and paste. Regular tomato and vegetable juices.  Fruits Dried fruits. Canned fruit in light or heavy syrup. Fruit juice.  Meat and Other Protein Products Fatty cuts of meat. Ribs, chicken wings, bacon, sausage, bologna, salami, chitterlings, fatback, hot dogs, bratwurst, and packaged luncheon meats. Salted nuts and seeds. Canned beans with salt.    Dairy Whole or 2% milk, cream, half-and-half, and cream cheese. Whole-fat or sweetened yogurt. Full-fat cheeses or blue cheese. Nondairy creamers and whipped toppings. Processed cheese,  cheese spreads, or cheese curds.  Condiments Onion and garlic salt, seasoned salt, table salt, and sea salt. Canned and packaged gravies. Worcestershire sauce. Tartar sauce. Barbecue sauce. Teriyaki sauce. Soy sauce, including reduced sodium. Steak sauce. Fish sauce. Oyster sauce. Cocktail sauce. Horseradish. Ketchup and mustard. Meat flavorings and tenderizers. Bouillon cubes. Hot sauce. Tabasco sauce. Marinades. Taco seasonings. Relishes.  Fats and Oils Butter, stick margarine, lard, shortening, ghee, and bacon fat. Coconut, palm kernel, or palm oils. Regular salad dressings.  Other Pickles and olives. Salted popcorn and pretzels.  The items listed above may not be a complete list of foods and beverages to avoid. Contact your dietitian for more information.  WHERE CAN I FIND MORE INFORMATION? National Heart, Lung, and [employer] Institute: www.nhlbi.nih.gov/health/health-topics/topics/dash/ Document Released: 01/26/2011 Document Revised: 06/23/2013 Document Reviewed: 12/11/2012 Exit[employer] Patient Information 2015 [employer]. This information is not intended to replace advice given to you by your health care provider. Make sure you discuss any questions you have with your health care provider.   I think that you would greatly benefit from seeing a nutritionist.  If you are interested, please call Dr Sykes at [phone] to schedule an appointment.   

## 2018-10-03 LAB — CBC WITH DIFFERENTIAL/PLATELET
Basophils Absolute: 0.1 10*3/uL (ref 0.0–0.2)
Basos: 1 %
EOS (ABSOLUTE): 0.1 10*3/uL (ref 0.0–0.4)
Eos: 1 %
Hematocrit: 42.4 % (ref 37.5–51.0)
Hemoglobin: 14.7 g/dL (ref 13.0–17.7)
Immature Grans (Abs): 0 10*3/uL (ref 0.0–0.1)
Immature Granulocytes: 0 %
Lymphocytes Absolute: 1.1 10*3/uL (ref 0.7–3.1)
Lymphs: 16 %
MCH: 32.3 pg (ref 26.6–33.0)
MCHC: 34.7 g/dL (ref 31.5–35.7)
MCV: 93 fL (ref 79–97)
Monocytes Absolute: 0.7 10*3/uL (ref 0.1–0.9)
Monocytes: 10 %
Neutrophils Absolute: 4.9 10*3/uL (ref 1.4–7.0)
Neutrophils: 72 %
Platelets: 195 10*3/uL (ref 150–450)
RBC: 4.55 x10E6/uL (ref 4.14–5.80)
RDW: 12.3 % (ref 11.6–15.4)
WBC: 6.8 10*3/uL (ref 3.4–10.8)

## 2018-10-03 LAB — VITAMIN D 25 HYDROXY (VIT D DEFICIENCY, FRACTURES): Vit D, 25-Hydroxy: 49.8 ng/mL (ref 30.0–100.0)

## 2018-10-03 LAB — CMP14+EGFR
ALT: 18 IU/L (ref 0–44)
AST: 25 IU/L (ref 0–40)
Albumin/Globulin Ratio: 2.3 — ABNORMAL HIGH (ref 1.2–2.2)
Albumin: 4.3 g/dL (ref 3.6–4.6)
Alkaline Phosphatase: 68 IU/L (ref 39–117)
BUN/Creatinine Ratio: 18 (ref 10–24)
BUN: 21 mg/dL (ref 8–27)
Bilirubin Total: 0.4 mg/dL (ref 0.0–1.2)
CO2: 26 mmol/L (ref 20–29)
Calcium: 9.3 mg/dL (ref 8.6–10.2)
Chloride: 103 mmol/L (ref 96–106)
Creatinine, Ser: 1.15 mg/dL (ref 0.76–1.27)
GFR calc Af Amer: 67 mL/min/{1.73_m2} (ref >59)
GFR calc non Af Amer: 58 mL/min/{1.73_m2} — ABNORMAL LOW (ref >59)
Globulin, Total: 1.9 g/dL (ref 1.5–4.5)
Glucose: 107 mg/dL — ABNORMAL HIGH (ref 65–99)
Potassium: 4.5 mmol/L (ref 3.5–5.2)
Sodium: 143 mmol/L (ref 134–144)
Total Protein: 6.2 g/dL (ref 6.0–8.5)

## 2018-10-03 LAB — THYROID PANEL WITH TSH
Free Thyroxine Index: 1.6 (ref 1.2–4.9)
T3 Uptake Ratio: 29 % (ref 24–39)
T4, Total: 5.4 ug/dL (ref 4.5–12.0)
TSH: 1.08 u[IU]/mL (ref 0.450–4.500)

## 2018-10-03 LAB — PSA, TOTAL AND FREE
PSA, Free Pct: 21.4 %
PSA, Free: 0.3 ng/mL
Prostate Specific Ag, Serum: 1.4 ng/mL (ref 0.0–4.0)

## 2018-10-03 LAB — LIPID PANEL
Chol/HDL Ratio: 2.1 ratio (ref 0.0–5.0)
Cholesterol, Total: 133 mg/dL (ref 100–199)
HDL: 64 mg/dL (ref >39)
LDL Calculated: 48 mg/dL (ref 0–99)
Triglycerides: 106 mg/dL (ref 0–149)
VLDL Cholesterol Cal: 21 mg/dL (ref 5–40)

## 2018-10-03 LAB — MICROALBUMIN / CREATININE URINE RATIO
Creatinine, Urine: 122.1 mg/dL
Microalb/Creat Ratio: 40 mg/g creat — ABNORMAL HIGH (ref 0–29)
Microalbumin, Urine: 48.5 ug/mL

## 2018-11-04 ENCOUNTER — Ambulatory Visit (INDEPENDENT_AMBULATORY_CARE_PROVIDER_SITE_OTHER): Payer: Medicare Other | Admitting: *Deleted

## 2018-11-04 DIAGNOSIS — M62830 Muscle spasm of back: Secondary | ICD-10-CM | POA: Diagnosis not present

## 2018-11-04 DIAGNOSIS — Z0000 Encounter for general adult medical examination without abnormal findings: Secondary | ICD-10-CM

## 2018-11-04 DIAGNOSIS — M9905 Segmental and somatic dysfunction of pelvic region: Secondary | ICD-10-CM | POA: Diagnosis not present

## 2018-11-04 DIAGNOSIS — M9904 Segmental and somatic dysfunction of sacral region: Secondary | ICD-10-CM | POA: Diagnosis not present

## 2018-11-04 DIAGNOSIS — M9903 Segmental and somatic dysfunction of lumbar region: Secondary | ICD-10-CM | POA: Diagnosis not present
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
## 2018-11-04 NOTE — Patient Instructions (Signed)
Preventive Care 75 Years and Older, Male Preventive care refers to lifestyle choices and visits with your health care provider that can promote health and wellness. This includes:  A yearly physical exam. This is also called an annual well check.  Regular dental and eye exams.  Immunizations.  Screening for certain conditions.  Healthy lifestyle choices, such as diet and exercise. What can I expect for my preventive care visit? Physical exam Your health care provider will check:  Height and weight. These may be used to calculate body mass index (BMI), which is a measurement that tells if you are at a healthy weight.  Heart rate and blood pressure.  Your skin for abnormal spots. Counseling Your health care provider may ask you questions about:  Alcohol, tobacco, and drug use.  Emotional well-being.  Home and relationship well-being.  Sexual activity.  Eating habits.  History of falls.  Memory and ability to understand (cognition).  Work and work Statistician. What immunizations do I need?  Influenza (flu) vaccine  This is recommended every year. Tetanus, diphtheria, and pertussis (Tdap) vaccine  You may need a Td booster every 10 years. Varicella (chickenpox) vaccine  You may need this vaccine if you have not already been vaccinated. Zoster (shingles) vaccine  You may need this after age 50. Pneumococcal conjugate (PCV13) vaccine  One dose is recommended after age 24. Pneumococcal polysaccharide (PPSV23) vaccine  One dose is recommended after age 33. Measles, mumps, and rubella (MMR) vaccine  You may need at least one dose of MMR if you were born in 1957 or later. You may also need a second dose. Meningococcal conjugate (MenACWY) vaccine  You may need this if you have certain conditions. Hepatitis A vaccine  You may need this if you have certain conditions or if you travel or work in places where you may be exposed to hepatitis A. Hepatitis B vaccine   You may need this if you have certain conditions or if you travel or work in places where you may be exposed to hepatitis B. Haemophilus influenzae type b (Hib) vaccine  You may need this if you have certain conditions. You may receive vaccines as individual doses or as more than one vaccine together in one shot (combination vaccines). Talk with your health care provider about the risks and benefits of combination vaccines. What tests do I need? Blood tests  Lipid and cholesterol levels. These may be checked every 5 years, or more frequently depending on your overall health.  Hepatitis C test.  Hepatitis B test. Screening  Lung cancer screening. You may have this screening every year starting at age 74 if you have a 30-pack-year history of smoking and currently smoke or have quit within the past 15 years.  Colorectal cancer screening. All adults should have this screening starting at age 57 and continuing until age 54. Your health care provider may recommend screening at age 47 if you are at increased risk. You will have tests every 1-10 years, depending on your results and the type of screening test.  Prostate cancer screening. Recommendations will vary depending on your family history and other risks.  Diabetes screening. This is done by checking your blood sugar (glucose) after you have not eaten for a while (fasting). You may have this done every 1-3 years.  Abdominal aortic aneurysm (AAA) screening. You may need this if you are a current or former smoker.  Sexually transmitted disease (STD) testing. Follow these instructions at home: Eating and drinking  Eat  a diet that includes fresh fruits and vegetables, whole grains, lean protein, and low-fat dairy products. Limit your intake of foods with high amounts of sugar, saturated fats, and salt.  Take vitamin and mineral supplements as recommended by your health care provider.  Do not drink alcohol if your health care provider  tells you not to drink.  If you drink alcohol: ? Limit how much you have to 0-2 drinks a day. ? Be aware of how much alcohol is in your drink. In the [place] one drink equals one 12 oz bottle of beer (355 mL), one 5 oz glass of wine (148 mL), or one 1 oz glass of hard liquor (44 mL). Lifestyle  Take daily care of your teeth and gums.  Stay active. Exercise for at least 30 minutes on 5 or more days each week.  Do not use any products that contain nicotine or tobacco, such as cigarettes, e-cigarettes, and chewing tobacco. If you need help quitting, ask your health care provider.  If you are sexually active, practice safe sex. Use a condom or other form of protection to prevent STIs (sexually transmitted infections).  Talk with your health care provider about taking a low-dose aspirin or statin. What's next?  Visit your health care provider once a year for a well check visit.  Ask your health care provider how often you should have your eyes and teeth checked.  Stay up to date on all vaccines. This information is not intended to replace advice given to you by your health care provider. Make sure you discuss any questions you have with your health care provider. Document Released: 03/05/2015 Document Revised: 01/31/2018 Document Reviewed: 01/31/2018 Elsevier Patient Education  2020 [employer]  

## 2018-11-07 DIAGNOSIS — M9903 Segmental and somatic dysfunction of lumbar region: Secondary | ICD-10-CM | POA: Diagnosis not present

## 2018-11-07 DIAGNOSIS — M62830 Muscle spasm of back: Secondary | ICD-10-CM | POA: Diagnosis not present

## 2018-11-07 DIAGNOSIS — M9904 Segmental and somatic dysfunction of sacral region: Secondary | ICD-10-CM | POA: Diagnosis not present

## 2018-11-07 DIAGNOSIS — M9905 Segmental and somatic dysfunction of pelvic region: Secondary | ICD-10-CM | POA: Diagnosis not present

## 2018-11-11 DIAGNOSIS — M9904 Segmental and somatic dysfunction of sacral region: Secondary | ICD-10-CM | POA: Diagnosis not present

## 2018-11-11 DIAGNOSIS — M9903 Segmental and somatic dysfunction of lumbar region: Secondary | ICD-10-CM | POA: Diagnosis not present

## 2018-11-11 DIAGNOSIS — M9905 Segmental and somatic dysfunction of pelvic region: Secondary | ICD-10-CM | POA: Diagnosis not present

## 2018-11-11 DIAGNOSIS — M62830 Muscle spasm of back: Secondary | ICD-10-CM | POA: Diagnosis not present

## 2018-11-18 DIAGNOSIS — N183 Chronic kidney disease, stage 3 (moderate): Secondary | ICD-10-CM | POA: Diagnosis not present

## 2018-11-18 DIAGNOSIS — N200 Calculus of kidney: Secondary | ICD-10-CM | POA: Diagnosis not present

## 2018-11-19 ENCOUNTER — Other Ambulatory Visit: Payer: Self-pay

## 2018-11-20 ENCOUNTER — Ambulatory Visit (INDEPENDENT_AMBULATORY_CARE_PROVIDER_SITE_OTHER): Payer: Medicare Other

## 2018-11-20 DIAGNOSIS — Z23 Encounter for immunization: Secondary | ICD-10-CM

## 2018-12-02 DIAGNOSIS — M9903 Segmental and somatic dysfunction of lumbar region: Secondary | ICD-10-CM | POA: Diagnosis not present

## 2018-12-02 DIAGNOSIS — M9905 Segmental and somatic dysfunction of pelvic region: Secondary | ICD-10-CM | POA: Diagnosis not present

## 2018-12-02 DIAGNOSIS — M62830 Muscle spasm of back: Secondary | ICD-10-CM | POA: Diagnosis not present

## 2018-12-02 DIAGNOSIS — M9904 Segmental and somatic dysfunction of sacral region: Secondary | ICD-10-CM | POA: Diagnosis not present

## 2018-12-05 DIAGNOSIS — M9905 Segmental and somatic dysfunction of pelvic region: Secondary | ICD-10-CM | POA: Diagnosis not present

## 2018-12-05 DIAGNOSIS — M9903 Segmental and somatic dysfunction of lumbar region: Secondary | ICD-10-CM | POA: Diagnosis not present

## 2018-12-05 DIAGNOSIS — M62830 Muscle spasm of back: Secondary | ICD-10-CM | POA: Diagnosis not present

## 2018-12-05 DIAGNOSIS — M9904 Segmental and somatic dysfunction of sacral region: Secondary | ICD-10-CM | POA: Diagnosis not present

## 2018-12-12 DIAGNOSIS — M9904 Segmental and somatic dysfunction of sacral region: Secondary | ICD-10-CM | POA: Diagnosis not present

## 2018-12-12 DIAGNOSIS — M9903 Segmental and somatic dysfunction of lumbar region: Secondary | ICD-10-CM | POA: Diagnosis not present

## 2018-12-12 DIAGNOSIS — M9905 Segmental and somatic dysfunction of pelvic region: Secondary | ICD-10-CM | POA: Diagnosis not present

## 2018-12-12 DIAGNOSIS — M62830 Muscle spasm of back: Secondary | ICD-10-CM | POA: Diagnosis not present

## 2019-01-02 ENCOUNTER — Other Ambulatory Visit: Payer: Self-pay | Admitting: Family Medicine

## 2019-01-02 DIAGNOSIS — M9903 Segmental and somatic dysfunction of lumbar region: Secondary | ICD-10-CM | POA: Diagnosis not present

## 2019-01-02 DIAGNOSIS — M9904 Segmental and somatic dysfunction of sacral region: Secondary | ICD-10-CM | POA: Diagnosis not present

## 2019-01-02 DIAGNOSIS — M62830 Muscle spasm of back: Secondary | ICD-10-CM | POA: Diagnosis not present

## 2019-01-02 DIAGNOSIS — M9905 Segmental and somatic dysfunction of pelvic region: Secondary | ICD-10-CM | POA: Diagnosis not present

## 2019-01-13 ENCOUNTER — Other Ambulatory Visit: Payer: Self-pay | Admitting: Nurse Practitioner

## 2019-02-08 ENCOUNTER — Other Ambulatory Visit: Payer: Self-pay | Admitting: Nurse Practitioner

## 2019-03-14 ENCOUNTER — Other Ambulatory Visit: Payer: Self-pay | Admitting: *Deleted

## 2019-03-14 ENCOUNTER — Other Ambulatory Visit: Payer: Self-pay | Admitting: Family Medicine

## 2019-03-14 MED ORDER — ALLOPURINOL 100 MG PO TABS: ORAL_TABLET | ORAL | 0 refills | Status: DC

## 2019-04-03 ENCOUNTER — Other Ambulatory Visit: Payer: Self-pay

## 2019-04-04 ENCOUNTER — Encounter: Payer: Self-pay | Admitting: Family Medicine

## 2019-04-04 ENCOUNTER — Ambulatory Visit (INDEPENDENT_AMBULATORY_CARE_PROVIDER_SITE_OTHER): Payer: Medicare Other | Admitting: Family Medicine

## 2019-04-04 ENCOUNTER — Other Ambulatory Visit: Payer: Self-pay

## 2019-04-04 VITALS — BP 134/66 | HR 79 | Temp 98.6°F | Resp 20 | Ht 68.0 in | Wt 152.0 lb

## 2019-04-04 DIAGNOSIS — E559 Vitamin D deficiency, unspecified: Secondary | ICD-10-CM | POA: Diagnosis not present

## 2019-04-04 DIAGNOSIS — I700 Atherosclerosis of aorta: Secondary | ICD-10-CM | POA: Diagnosis not present

## 2019-04-04 DIAGNOSIS — N183 Chronic kidney disease, stage 3 unspecified: Secondary | ICD-10-CM | POA: Diagnosis not present

## 2019-04-04 DIAGNOSIS — E785 Hyperlipidemia, unspecified: Secondary | ICD-10-CM | POA: Diagnosis not present

## 2019-04-04 DIAGNOSIS — I10 Essential (primary) hypertension: Secondary | ICD-10-CM | POA: Insufficient documentation

## 2019-04-04 DIAGNOSIS — E782 Mixed hyperlipidemia: Secondary | ICD-10-CM

## 2019-04-04 DIAGNOSIS — M109 Gout, unspecified: Secondary | ICD-10-CM

## 2019-04-04 DIAGNOSIS — D696 Thrombocytopenia, unspecified: Secondary | ICD-10-CM

## 2019-04-04 DIAGNOSIS — I129 Hypertensive chronic kidney disease with stage 1 through stage 4 chronic kidney disease, or unspecified chronic kidney disease: Secondary | ICD-10-CM | POA: Diagnosis not present

## 2019-04-04 DIAGNOSIS — N401 Enlarged prostate with lower urinary tract symptoms: Secondary | ICD-10-CM

## 2019-04-04 DIAGNOSIS — I480 Paroxysmal atrial fibrillation: Secondary | ICD-10-CM | POA: Diagnosis not present

## 2019-04-04 DIAGNOSIS — R351 Nocturia: Secondary | ICD-10-CM
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
## 2019-04-04 NOTE — Patient Instructions (Signed)
DASH Eating Plan DASH stands for "Dietary Approaches to Stop Hypertension." The DASH eating plan is a healthy eating plan that has been shown to reduce high blood pressure (hypertension). Additional health benefits may include reducing the risk of type 2 diabetes mellitus, heart disease, and stroke. The DASH eating plan may also help with weight loss.  WHAT DO I NEED TO KNOW ABOUT THE DASH EATING PLAN? For the DASH eating plan, you will follow these general guidelines:  Choose foods with a percent daily value for sodium of less than 5% (as listed on the food label).  Use salt-free seasonings or herbs instead of table salt or sea salt.  Check with your health care provider or pharmacist before using salt substitutes.  Eat lower-sodium products, often labeled as "lower sodium" or "no salt added."  Eat fresh foods.  Eat more vegetables, fruits, and low-fat dairy products.  Choose whole grains. Look for the word "whole" as the first word in the ingredient list.  Choose fish and skinless chicken or turkey more often than red meat. Limit fish, poultry, and meat to 6 oz (170 g) each day.  Limit sweets, desserts, sugars, and sugary drinks.  Choose heart-healthy fats.  Limit cheese to 1 oz (28 g) per day.  Eat more home-cooked food and less restaurant, buffet, and fast food.  Limit fried foods.  Cook foods using methods other than frying.  Limit canned vegetables. If you do use them, rinse them well to decrease the sodium.  When eating at a restaurant, ask that your food be prepared with less salt, or no salt if possible.  WHAT FOODS CAN I EAT? Seek help from a dietitian for individual calorie needs.  Grains Whole grain or whole wheat bread. Brown rice. Whole grain or whole wheat pasta. Quinoa, bulgur, and whole grain cereals. Low-sodium cereals. Corn or whole wheat flour tortillas. Whole grain cornbread. Whole grain crackers. Low-sodium crackers.  Vegetables Fresh or frozen  vegetables (raw, steamed, roasted, or grilled). Low-sodium or reduced-sodium tomato and vegetable juices. Low-sodium or reduced-sodium tomato sauce and paste. Low-sodium or reduced-sodium canned vegetables.   Fruits All fresh, canned (in natural juice), or frozen fruits.  Meat and Other Protein Products Ground beef (85% or leaner), grass-fed beef, or beef trimmed of fat. Skinless chicken or turkey. Ground chicken or turkey. Pork trimmed of fat. All fish and seafood. Eggs. Dried beans, peas, or lentils. Unsalted nuts and seeds. Unsalted canned beans.  Dairy Low-fat dairy products, such as skim or 1% milk, 2% or reduced-fat cheeses, low-fat ricotta or cottage cheese, or plain low-fat yogurt. Low-sodium or reduced-sodium cheeses.  Fats and Oils Tub margarines without trans fats. Light or reduced-fat mayonnaise and salad dressings (reduced sodium). Avocado. Safflower, olive, or canola oils. Natural peanut or almond butter.  Other Unsalted popcorn and pretzels. The items listed above may not be a complete list of recommended foods or beverages. Contact your dietitian for more options.  WHAT FOODS ARE NOT RECOMMENDED?  Grains White bread. White pasta. White rice. Refined cornbread. Bagels and croissants. Crackers that contain trans fat.  Vegetables Creamed or fried vegetables. Vegetables in a cheese sauce. Regular canned vegetables. Regular canned tomato sauce and paste. Regular tomato and vegetable juices.  Fruits Dried fruits. Canned fruit in light or heavy syrup. Fruit juice.  Meat and Other Protein Products Fatty cuts of meat. Ribs, chicken wings, bacon, sausage, bologna, salami, chitterlings, fatback, hot dogs, bratwurst, and packaged luncheon meats. Salted nuts and seeds. Canned beans with salt.    Dairy Whole or 2% milk, cream, half-and-half, and cream cheese. Whole-fat or sweetened yogurt. Full-fat cheeses or blue cheese. Nondairy creamers and whipped toppings. Processed cheese,  cheese spreads, or cheese curds.  Condiments Onion and garlic salt, seasoned salt, table salt, and sea salt. Canned and packaged gravies. Worcestershire sauce. Tartar sauce. Barbecue sauce. Teriyaki sauce. Soy sauce, including reduced sodium. Steak sauce. Fish sauce. Oyster sauce. Cocktail sauce. Horseradish. Ketchup and mustard. Meat flavorings and tenderizers. Bouillon cubes. Hot sauce. Tabasco sauce. Marinades. Taco seasonings. Relishes.  Fats and Oils Butter, stick margarine, lard, shortening, ghee, and bacon fat. Coconut, palm kernel, or palm oils. Regular salad dressings.  Other Pickles and olives. Salted popcorn and pretzels.  The items listed above may not be a complete list of foods and beverages to avoid. Contact your dietitian for more information.  WHERE CAN I FIND MORE INFORMATION? National Heart, Lung, and [employer] Institute: www.nhlbi.nih.gov/health/health-topics/topics/dash/ Document Released: 01/26/2011 Document Revised: 06/23/2013 Document Reviewed: 12/11/2012 Exit[employer] Patient Information 2015 [employer]. This information is not intended to replace advice given to you by your health care provider. Make sure you discuss any questions you have with your health care provider.   I think that you would greatly benefit from seeing a nutritionist.  If you are interested, please call Dr Sykes at [phone] to schedule an appointment.   

## 2019-04-05 LAB — CMP14+EGFR
ALT: 14 IU/L (ref 0–44)
AST: 22 IU/L (ref 0–40)
Albumin/Globulin Ratio: 1.9 (ref 1.2–2.2)
Albumin: 4.4 g/dL (ref 3.6–4.6)
Alkaline Phosphatase: 91 IU/L (ref 39–117)
BUN/Creatinine Ratio: 16 (ref 10–24)
BUN: 23 mg/dL (ref 8–27)
Bilirubin Total: 0.7 mg/dL (ref 0.0–1.2)
CO2: 23 mmol/L (ref 20–29)
Calcium: 9.2 mg/dL (ref 8.6–10.2)
Chloride: 103 mmol/L (ref 96–106)
Creatinine, Ser: 1.48 mg/dL — ABNORMAL HIGH (ref 0.76–1.27)
GFR calc Af Amer: 49 mL/min/{1.73_m2} — ABNORMAL LOW (ref >59)
GFR calc non Af Amer: 42 mL/min/{1.73_m2} — ABNORMAL LOW (ref >59)
Globulin, Total: 2.3 g/dL (ref 1.5–4.5)
Glucose: 85 mg/dL (ref 65–99)
Potassium: 4.4 mmol/L (ref 3.5–5.2)
Sodium: 143 mmol/L (ref 134–144)
Total Protein: 6.7 g/dL (ref 6.0–8.5)

## 2019-04-05 LAB — CBC WITH DIFFERENTIAL/PLATELET
Basophils Absolute: 0.1 10*3/uL (ref 0.0–0.2)
Basos: 1 %
EOS (ABSOLUTE): 0.3 10*3/uL (ref 0.0–0.4)
Eos: 4 %
Hematocrit: 42.9 % (ref 37.5–51.0)
Hemoglobin: 14.5 g/dL (ref 13.0–17.7)
Immature Grans (Abs): 0 10*3/uL (ref 0.0–0.1)
Immature Granulocytes: 0 %
Lymphocytes Absolute: 0.9 10*3/uL (ref 0.7–3.1)
Lymphs: 14 %
MCH: 31.8 pg (ref 26.6–33.0)
MCHC: 33.8 g/dL (ref 31.5–35.7)
MCV: 94 fL (ref 79–97)
Monocytes Absolute: 0.7 10*3/uL (ref 0.1–0.9)
Monocytes: 11 %
Neutrophils Absolute: 4.4 10*3/uL (ref 1.4–7.0)
Neutrophils: 70 %
Platelets: 204 10*3/uL (ref 150–450)
RBC: 4.56 x10E6/uL (ref 4.14–5.80)
RDW: 12.8 % (ref 11.6–15.4)
WBC: 6.4 10*3/uL (ref 3.4–10.8)

## 2019-04-05 LAB — THYROID PANEL WITH TSH
Free Thyroxine Index: 1.7 (ref 1.2–4.9)
T3 Uptake Ratio: 29 % (ref 24–39)
T4, Total: 5.8 ug/dL (ref 4.5–12.0)
TSH: 1.83 u[IU]/mL (ref 0.450–4.500)

## 2019-04-05 LAB — LIPID PANEL
Chol/HDL Ratio: 2.1 ratio (ref 0.0–5.0)
Cholesterol, Total: 140 mg/dL (ref 100–199)
HDL: 68 mg/dL (ref >39)
LDL Chol Calc (NIH): 60 mg/dL (ref 0–99)
Triglycerides: 59 mg/dL (ref 0–149)
VLDL Cholesterol Cal: 12 mg/dL (ref 5–40)

## 2019-04-05 LAB — VITAMIN D 25 HYDROXY (VIT D DEFICIENCY, FRACTURES): Vit D, 25-Hydroxy: 49.9 ng/mL (ref 30.0–100.0)

## 2019-04-05 LAB — URIC ACID: Uric Acid: 8 mg/dL (ref 3.8–8.4)

## 2019-04-11 ENCOUNTER — Other Ambulatory Visit: Payer: Self-pay | Admitting: Family Medicine

## 2019-04-11 NOTE — Telephone Encounter (Signed)
Temovate last rx'd 03/14/19-no refills Fluocinonide last rx'd 09/25/2018 with 2 refills Pt last seen with you 04/04/19

## 2019-05-07 ENCOUNTER — Other Ambulatory Visit: Payer: Self-pay | Admitting: Family Medicine

## 2019-06-02 DIAGNOSIS — N1831 Chronic kidney disease, stage 3a: Secondary | ICD-10-CM | POA: Diagnosis not present

## 2019-06-02 DIAGNOSIS — N200 Calculus of kidney: Secondary | ICD-10-CM | POA: Diagnosis not present

## 2019-06-04 ENCOUNTER — Telehealth: Payer: Self-pay | Admitting: Family Medicine

## 2019-06-04 NOTE — Chronic Care Management (AMB) (Signed)
  Chronic Care Management   Note  06/04/2019 Name: BACILIO ABASCAL MRN: [id number] DOB: 1930-08-27  Brian Blankenship is a 86 y.o. year old male who is a primary care patient of [name], [name], FNP. I reached out to [name] by phone today in response to a referral sent by [name]'s health plan.     Mr. Wortley was given information about Chronic Care Management services today including:  1. CCM service includes personalized support from designated clinical staff supervised by his physician, including individualized plan of care and coordination with other care providers 2. 24/7 contact phone numbers for assistance for urgent and routine care needs. 3. Service will only be billed when office clinical staff spend 20 minutes or more in a month to coordinate care. 4. Only one practitioner may furnish and bill the service in a calendar month. 5. The patient may stop CCM services at any time (effective at the end of the month) by phone call to the office staff. 6. The patient will be responsible for cost sharing (co-pay) of up to 20% of the service fee (after annual deductible is met).  Patient did not agree to enrollment in care management services and does not wish to consider at this time.  Follow up plan: The patient has been provided with contact information for the care management team and has been advised to call with any health related questions or concerns.   [place], [place] [address] Direct Dial: [phone] [email or web address]: [email or web address]

## 2019-06-04 NOTE — Chronic Care Management (AMB) (Signed)
  Chronic Care Management   Outreach Note  06/04/2019 Name: Brian Blankenship MRN: [id number] DOB: 1930/08/27  Brian Blankenship is a 86 y.o. year old male who is a primary care patient of [name], [name], FNP. I reached out to [name] by phone today in response to a referral sent by Brian Blankenship's health plan.     An unsuccessful telephone outreach was attempted today. The patient was referred to the case management team for assistance with care management and care coordination.   Follow Up Plan: A HIPPA compliant phone message was left for the patient providing contact information and requesting a return call.  The care management team will reach out to the patient again over the next 7 days. If patient returns call to provider office, please advise to call [place] at [phone].  [place], [place] [address] Direct Dial: [phone] [email or web address]: [email or web address]

## 2019-06-23 ENCOUNTER — Other Ambulatory Visit: Payer: Self-pay | Admitting: Family Medicine

## 2019-06-23 MED ORDER — CLOBETASOL PROPIONATE 0.05 % EX OINT: TOPICAL_OINTMENT | CUTANEOUS | 0 refills | Status: DC

## 2019-06-23 MED ORDER — FLUOCINONIDE 0.05 % EX SOLN: CUTANEOUS | 0 refills | Status: DC

## 2019-07-23 ENCOUNTER — Other Ambulatory Visit: Payer: Self-pay | Admitting: Family Medicine

## 2019-07-23 ENCOUNTER — Other Ambulatory Visit: Payer: Self-pay

## 2019-07-23 DIAGNOSIS — N183 Chronic kidney disease, stage 3 unspecified: Secondary | ICD-10-CM

## 2019-07-23 MED ORDER — OLMESARTAN MEDOXOMIL-HCTZ 40-25 MG PO TABS: 1.0000 | ORAL_TABLET | Freq: Every day | ORAL | 0 refills | Status: DC

## 2019-07-23 NOTE — Telephone Encounter (Signed)
Patient calling to let us know he has an appointment scheduled on 10/01/2019 with Dr. Lajuana Ripple.

## 2019-08-16 ENCOUNTER — Other Ambulatory Visit: Payer: Self-pay | Admitting: Family Medicine

## 2019-08-16 DIAGNOSIS — N183 Chronic kidney disease, stage 3 unspecified: Secondary | ICD-10-CM

## 2019-08-16 DIAGNOSIS — I129 Hypertensive chronic kidney disease with stage 1 through stage 4 chronic kidney disease, or unspecified chronic kidney disease: Secondary | ICD-10-CM

## 2019-08-26 ENCOUNTER — Other Ambulatory Visit: Payer: Self-pay | Admitting: Nurse Practitioner

## 2019-09-03 ENCOUNTER — Other Ambulatory Visit: Payer: Self-pay | Admitting: Nurse Practitioner

## 2019-09-11 DIAGNOSIS — M62830 Muscle spasm of back: Secondary | ICD-10-CM | POA: Diagnosis not present

## 2019-09-11 DIAGNOSIS — M9904 Segmental and somatic dysfunction of sacral region: Secondary | ICD-10-CM | POA: Diagnosis not present

## 2019-09-11 DIAGNOSIS — M9905 Segmental and somatic dysfunction of pelvic region: Secondary | ICD-10-CM | POA: Diagnosis not present

## 2019-09-11 DIAGNOSIS — M9903 Segmental and somatic dysfunction of lumbar region: Secondary | ICD-10-CM | POA: Diagnosis not present

## 2019-09-15 DIAGNOSIS — M9903 Segmental and somatic dysfunction of lumbar region: Secondary | ICD-10-CM | POA: Diagnosis not present

## 2019-09-15 DIAGNOSIS — M9904 Segmental and somatic dysfunction of sacral region: Secondary | ICD-10-CM | POA: Diagnosis not present

## 2019-09-15 DIAGNOSIS — M62830 Muscle spasm of back: Secondary | ICD-10-CM | POA: Diagnosis not present

## 2019-09-15 DIAGNOSIS — M9905 Segmental and somatic dysfunction of pelvic region: Secondary | ICD-10-CM | POA: Diagnosis not present

## 2019-09-17 DIAGNOSIS — M9904 Segmental and somatic dysfunction of sacral region: Secondary | ICD-10-CM | POA: Diagnosis not present

## 2019-09-17 DIAGNOSIS — M62830 Muscle spasm of back: Secondary | ICD-10-CM | POA: Diagnosis not present

## 2019-09-17 DIAGNOSIS — M9905 Segmental and somatic dysfunction of pelvic region: Secondary | ICD-10-CM | POA: Diagnosis not present

## 2019-09-17 DIAGNOSIS — M9903 Segmental and somatic dysfunction of lumbar region: Secondary | ICD-10-CM | POA: Diagnosis not present

## 2019-09-18 DIAGNOSIS — M9903 Segmental and somatic dysfunction of lumbar region: Secondary | ICD-10-CM | POA: Diagnosis not present

## 2019-09-18 DIAGNOSIS — M9904 Segmental and somatic dysfunction of sacral region: Secondary | ICD-10-CM | POA: Diagnosis not present

## 2019-09-18 DIAGNOSIS — M62830 Muscle spasm of back: Secondary | ICD-10-CM | POA: Diagnosis not present

## 2019-09-18 DIAGNOSIS — M9905 Segmental and somatic dysfunction of pelvic region: Secondary | ICD-10-CM | POA: Diagnosis not present

## 2019-09-19 ENCOUNTER — Other Ambulatory Visit: Payer: Self-pay | Admitting: Family Medicine

## 2019-09-19 DIAGNOSIS — I129 Hypertensive chronic kidney disease with stage 1 through stage 4 chronic kidney disease, or unspecified chronic kidney disease: Secondary | ICD-10-CM

## 2019-09-22 DIAGNOSIS — M9903 Segmental and somatic dysfunction of lumbar region: Secondary | ICD-10-CM | POA: Diagnosis not present

## 2019-09-22 DIAGNOSIS — M9904 Segmental and somatic dysfunction of sacral region: Secondary | ICD-10-CM | POA: Diagnosis not present

## 2019-09-22 DIAGNOSIS — M9905 Segmental and somatic dysfunction of pelvic region: Secondary | ICD-10-CM | POA: Diagnosis not present

## 2019-09-22 DIAGNOSIS — M62830 Muscle spasm of back: Secondary | ICD-10-CM | POA: Diagnosis not present

## 2019-10-01 ENCOUNTER — Other Ambulatory Visit: Payer: Self-pay

## 2019-10-01 ENCOUNTER — Ambulatory Visit (INDEPENDENT_AMBULATORY_CARE_PROVIDER_SITE_OTHER): Payer: Medicare Other | Admitting: Family Medicine

## 2019-10-01 ENCOUNTER — Encounter: Payer: Self-pay | Admitting: Family Medicine

## 2019-10-01 VITALS — BP 123/61 | HR 74 | Temp 97.7°F | Ht 68.0 in | Wt 153.0 lb

## 2019-10-01 DIAGNOSIS — N401 Enlarged prostate with lower urinary tract symptoms: Secondary | ICD-10-CM | POA: Diagnosis not present

## 2019-10-01 DIAGNOSIS — R351 Nocturia: Secondary | ICD-10-CM

## 2019-10-01 DIAGNOSIS — Z7689 Persons encountering health services in other specified circumstances: Secondary | ICD-10-CM | POA: Diagnosis not present

## 2019-10-01 DIAGNOSIS — K5909 Other constipation: Secondary | ICD-10-CM | POA: Diagnosis not present

## 2019-10-01 DIAGNOSIS — N183 Chronic kidney disease, stage 3 unspecified: Secondary | ICD-10-CM | POA: Diagnosis not present

## 2019-10-01 DIAGNOSIS — E785 Hyperlipidemia, unspecified: Secondary | ICD-10-CM | POA: Diagnosis not present

## 2019-10-01 DIAGNOSIS — I129 Hypertensive chronic kidney disease with stage 1 through stage 4 chronic kidney disease, or unspecified chronic kidney disease: Secondary | ICD-10-CM

## 2019-10-01 MED ORDER — FINASTERIDE 5 MG PO TABS: 5.0000 mg | ORAL_TABLET | Freq: Every day | ORAL | 3 refills | Status: DC

## 2019-10-01 MED ORDER — OLMESARTAN MEDOXOMIL-HCTZ 40-25 MG PO TABS: 1.0000 | ORAL_TABLET | Freq: Every day | ORAL | 3 refills | Status: DC

## 2019-10-01 MED ORDER — ATORVASTATIN CALCIUM 20 MG PO TABS: 20.0000 mg | ORAL_TABLET | Freq: Every day | ORAL | 3 refills | Status: DC

## 2019-10-01 MED ORDER — ALLOPURINOL 100 MG PO TABS: 100.0000 mg | ORAL_TABLET | Freq: Every day | ORAL | 3 refills | Status: DC

## 2019-10-01 NOTE — Patient Instructions (Signed)
You had labs performed today.  You will be contacted with the results of the labs once they are available, usually in the next 3 business days for routine lab work.  If you have an active my chart account, they will be released to your MyChart.  If you prefer to have these labs released to you via telephone, please let us know.  If you had a pap smear or biopsy performed, expect to be contacted in about 7-10 days.  COLACE daily to soften stool.  If does not improve bowel movements, please call me and I will place a prescription sample of medication up front for you to try.
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
## 2019-10-02 ENCOUNTER — Ambulatory Visit: Payer: Medicare Other | Admitting: Family Medicine

## 2019-10-02 LAB — RENAL FUNCTION PANEL
Albumin: 4.2 g/dL (ref 3.6–4.6)
BUN/Creatinine Ratio: 14 (ref 10–24)
BUN: 19 mg/dL (ref 8–27)
CO2: 23 mmol/L (ref 20–29)
Calcium: 9.3 mg/dL (ref 8.6–10.2)
Chloride: 102 mmol/L (ref 96–106)
Creatinine, Ser: 1.37 mg/dL — ABNORMAL HIGH (ref 0.76–1.27)
GFR calc Af Amer: 54 mL/min/{1.73_m2} — ABNORMAL LOW (ref >59)
GFR calc non Af Amer: 46 mL/min/{1.73_m2} — ABNORMAL LOW (ref >59)
Glucose: 94 mg/dL (ref 65–99)
Phosphorus: 3.3 mg/dL (ref 2.8–4.1)
Potassium: 4.6 mmol/L (ref 3.5–5.2)
Sodium: 140 mmol/L (ref 134–144)

## 2019-10-02 LAB — PSA: Prostate Specific Ag, Serum: 1.4 ng/mL (ref 0.0–4.0)

## 2019-11-05 ENCOUNTER — Ambulatory Visit (INDEPENDENT_AMBULATORY_CARE_PROVIDER_SITE_OTHER): Payer: Medicare Other | Admitting: *Deleted

## 2019-11-05 VITALS — Ht 68.0 in | Wt 153.0 lb

## 2019-11-05 DIAGNOSIS — Z0000 Encounter for general adult medical examination without abnormal findings: Secondary | ICD-10-CM | POA: Diagnosis not present
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
## 2019-11-05 NOTE — Patient Instructions (Signed)
[place] Maintenance Summary and Written Plan of Care  Brian Blankenship ,  Thank you for allowing me to perform your Medicare Annual Wellness Visit and for your ongoing commitment to your health.   Health Maintenance & Immunization History Health Maintenance  Topic Date Due  . INFLUENZA VACCINE  01/01/2020 (Originally 09/21/2019)  . TETANUS/TDAP  02/27/2024  . COVID-19 Vaccine  Completed  . PNA vac Low Risk Adult  Completed   Immunization History  Administered Date(s) Administered  . Fluad Quad(high Dose 65+) 11/20/2018  . Influenza Split 11/21/2011  . Influenza Whole 10/21/2009  . Influenza, High Dose Seasonal PF 11/25/2015, 11/28/2016, 11/28/2017  . Influenza,inj,Quad PF,6+ Mos 11/23/2014  . Influenza-Unspecified 12/12/2013  . Moderna SARS-COVID-2 Vaccination 03/04/2019, 04/01/2019  . Pneumococcal Conjugate-13 02/24/2013, 02/24/2013  . Pneumococcal Polysaccharide-23 02/20/2001  . Td 01/21/2004  . Tdap 02/26/2014  . Zoster 02/20/2006    These are the patient goals that we discussed: Goals Addressed            This Visit's Progress   . DIET - INCREASE WATER INTAKE       Try to drink 6-8 glasses of water daily.  11/05/2019 AWV Goal: Improved Nutrition/Diet  . Patient will verbalize understanding that diet plays an important role in overall health and that a poor diet is a risk factor for many chronic medical conditions.  . Over the next year, patient will improve self management of their diet by incorporating better variety. . Patient will utilize available community resources to help with food acquisition if needed (ex: food pantries, [address], etc) . Patient will work with nutrition specialist if a referral was made     . Exercise 3x per week (30 min per time)       Work out at [employer] 3 times per week for 30 minutes.   11/05/2019 AWV Goal: Exercise for General Health   Patient will verbalize understanding of the benefits of increased  physical activity:  Exercising regularly is important. It will improve your overall fitness, flexibility, and endurance.  Regular exercise also will improve your overall health. It can help you control your weight, reduce stress, and improve your bone density.  Over the next year, patient will increase physical activity as tolerated with a goal of at least 150 minutes of moderate physical activity per week.   You can tell that you are exercising at a moderate intensity if your heart starts beating faster and you start breathing faster but can still hold a conversation.  Moderate-intensity exercise ideas include:  Walking 1 mile (1.6 km) in about 15 minutes  Biking  Hiking  Golfing  Dancing  Water aerobics  Patient will verbalize understanding of everyday activities that increase physical activity by providing examples like the following: ? Yard work, such as: ? Pushing a Conservation officer, nature ? Raking and bagging leaves ? Washing your car ? Pushing a stroller ? Shoveling snow ? Gardening ? Washing windows or floors  Patient will be able to explain general safety guidelines for exercising:   Before you start a new exercise program, talk with your health care provider.  Do not exercise so much that you hurt yourself, feel dizzy, or get very short of breath.  Wear comfortable clothes and wear shoes with good support.  Drink plenty of water while you exercise to prevent dehydration or heat stroke.  Work out until your breathing and your heartbeat get faster.         This  is a list of Health Maintenance Items that are overdue or due now: There are no preventive care reminders to display for this patient.   Orders/Referrals Placed Today: No orders of the defined types were placed in this encounter.  (Contact our referral department at [phone] if you have not spoken with someone about your referral appointment within the next 5 days)    Follow-up Plan Follow up with Dr.  Lajuana Ripple as planned Bring in Copy of Advance Directive to be scanned into chart  Call urologist to discuss urinary frequency Keep a Log of BP readings

## 2019-11-10 ENCOUNTER — Other Ambulatory Visit: Payer: Self-pay | Admitting: Nurse Practitioner

## 2019-11-13 DIAGNOSIS — H6123 Impacted cerumen, bilateral: Secondary | ICD-10-CM | POA: Diagnosis not present

## 2019-11-17 DIAGNOSIS — M62830 Muscle spasm of back: Secondary | ICD-10-CM | POA: Diagnosis not present

## 2019-11-17 DIAGNOSIS — M9904 Segmental and somatic dysfunction of sacral region: Secondary | ICD-10-CM | POA: Diagnosis not present

## 2019-11-17 DIAGNOSIS — M9903 Segmental and somatic dysfunction of lumbar region: Secondary | ICD-10-CM | POA: Diagnosis not present

## 2019-11-17 DIAGNOSIS — M9905 Segmental and somatic dysfunction of pelvic region: Secondary | ICD-10-CM | POA: Diagnosis not present

## 2019-11-19 ENCOUNTER — Other Ambulatory Visit: Payer: Self-pay | Admitting: Nurse Practitioner

## 2019-11-19 DIAGNOSIS — M62830 Muscle spasm of back: Secondary | ICD-10-CM | POA: Diagnosis not present

## 2019-11-19 DIAGNOSIS — M9903 Segmental and somatic dysfunction of lumbar region: Secondary | ICD-10-CM | POA: Diagnosis not present

## 2019-11-19 DIAGNOSIS — M9904 Segmental and somatic dysfunction of sacral region: Secondary | ICD-10-CM | POA: Diagnosis not present

## 2019-11-19 DIAGNOSIS — M9905 Segmental and somatic dysfunction of pelvic region: Secondary | ICD-10-CM | POA: Diagnosis not present

## 2019-11-24 DIAGNOSIS — M9905 Segmental and somatic dysfunction of pelvic region: Secondary | ICD-10-CM | POA: Diagnosis not present

## 2019-11-24 DIAGNOSIS — M62830 Muscle spasm of back: Secondary | ICD-10-CM | POA: Diagnosis not present

## 2019-11-24 DIAGNOSIS — M9903 Segmental and somatic dysfunction of lumbar region: Secondary | ICD-10-CM | POA: Diagnosis not present

## 2019-11-24 DIAGNOSIS — M9904 Segmental and somatic dysfunction of sacral region: Secondary | ICD-10-CM | POA: Diagnosis not present

## 2019-11-26 DIAGNOSIS — M62830 Muscle spasm of back: Secondary | ICD-10-CM | POA: Diagnosis not present

## 2019-11-26 DIAGNOSIS — M9904 Segmental and somatic dysfunction of sacral region: Secondary | ICD-10-CM | POA: Diagnosis not present

## 2019-11-26 DIAGNOSIS — M9903 Segmental and somatic dysfunction of lumbar region: Secondary | ICD-10-CM | POA: Diagnosis not present

## 2019-11-26 DIAGNOSIS — M9905 Segmental and somatic dysfunction of pelvic region: Secondary | ICD-10-CM | POA: Diagnosis not present

## 2019-12-03 DIAGNOSIS — M9905 Segmental and somatic dysfunction of pelvic region: Secondary | ICD-10-CM | POA: Diagnosis not present

## 2019-12-03 DIAGNOSIS — M62830 Muscle spasm of back: Secondary | ICD-10-CM | POA: Diagnosis not present

## 2019-12-03 DIAGNOSIS — M9903 Segmental and somatic dysfunction of lumbar region: Secondary | ICD-10-CM | POA: Diagnosis not present

## 2019-12-03 DIAGNOSIS — M9904 Segmental and somatic dysfunction of sacral region: Secondary | ICD-10-CM | POA: Diagnosis not present

## 2019-12-12 ENCOUNTER — Telehealth: Payer: Self-pay

## 2019-12-15 NOTE — Telephone Encounter (Signed)
Pa in process for shingrix   Key: [id number] - PA Case ID: [id number] Need help? Call us at [phone] Status Sent to Plan today Drug Shingrix 50MCG/0.5ML suspension

## 2019-12-16 NOTE — Telephone Encounter (Signed)
Patient notified that prior authorization is not required.  N/Aon October 25 This medication or product is on your plan's list of covered drugs. Prior authorization is not required at this time. If your pharmacy has questions regarding the processing of your prescription, please have them call the [facility] pharmacy help desk at ([phone]. **Please note: Formulary lowering, tiering exception, cost reduction and prospective Medicare hospice reviews cannot be requested using this method of submission. Please contact us at [phone] instead.

## 2019-12-18 ENCOUNTER — Telehealth: Payer: Self-pay

## 2019-12-18 NOTE — Telephone Encounter (Signed)
Patient aware he can schedule shingles vaccine anytime now. As of today his cost would be $47 but he understands they will run it the day of visit. He states he will call back to schedule.

## 2019-12-29 ENCOUNTER — Ambulatory Visit (INDEPENDENT_AMBULATORY_CARE_PROVIDER_SITE_OTHER): Payer: Medicare Other

## 2019-12-29 ENCOUNTER — Other Ambulatory Visit: Payer: Self-pay

## 2019-12-29 DIAGNOSIS — Z23 Encounter for immunization: Secondary | ICD-10-CM

## 2019-12-29 NOTE — Progress Notes (Signed)
Shingrix vaccine given to left deltoid.  Patient tolerated well. 

## 2020-01-14 ENCOUNTER — Other Ambulatory Visit: Payer: Self-pay | Admitting: Family Medicine

## 2020-01-26 ENCOUNTER — Telehealth: Payer: Self-pay

## 2020-01-26 NOTE — Telephone Encounter (Signed)
Updated.

## 2020-02-10 ENCOUNTER — Other Ambulatory Visit: Payer: Self-pay | Admitting: Nurse Practitioner

## 2020-03-18 DIAGNOSIS — B351 Tinea unguium: Secondary | ICD-10-CM | POA: Diagnosis not present

## 2020-03-18 DIAGNOSIS — M79676 Pain in unspecified toe(s): Secondary | ICD-10-CM | POA: Diagnosis not present

## 2020-03-29 ENCOUNTER — Other Ambulatory Visit: Payer: Self-pay | Admitting: Family Medicine

## 2020-04-02 ENCOUNTER — Other Ambulatory Visit: Payer: Self-pay

## 2020-04-02 ENCOUNTER — Ambulatory Visit (INDEPENDENT_AMBULATORY_CARE_PROVIDER_SITE_OTHER): Payer: Medicare Other | Admitting: Family Medicine

## 2020-04-02 VITALS — BP 138/58 | HR 65 | Temp 98.0°F | Ht 68.0 in | Wt 153.0 lb

## 2020-04-02 DIAGNOSIS — I129 Hypertensive chronic kidney disease with stage 1 through stage 4 chronic kidney disease, or unspecified chronic kidney disease: Secondary | ICD-10-CM | POA: Diagnosis not present

## 2020-04-02 DIAGNOSIS — E785 Hyperlipidemia, unspecified: Secondary | ICD-10-CM

## 2020-04-02 DIAGNOSIS — N183 Chronic kidney disease, stage 3 unspecified: Secondary | ICD-10-CM | POA: Diagnosis not present

## 2020-04-02 DIAGNOSIS — K5909 Other constipation: Secondary | ICD-10-CM | POA: Diagnosis not present

## 2020-04-02 MED ORDER — OLMESARTAN MEDOXOMIL 40 MG PO TABS: 40.0000 mg | ORAL_TABLET | Freq: Every day | ORAL | 3 refills | Status: DC

## 2020-04-02 NOTE — Patient Instructions (Signed)
We have discontinued the HCTZ portion of your blood pressure pill. This is the portion that increases light sensitivity Keep an eye on blood pressure.  Your GOAL BP is no less than 110/60 and no higher than 150/90  Ok to continue the [employer] for constipation
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
## 2020-04-03 LAB — CBC
Hematocrit: 41.9 % (ref 37.5–51.0)
Hemoglobin: 14.6 g/dL (ref 13.0–17.7)
MCH: 31.9 pg (ref 26.6–33.0)
MCHC: 34.8 g/dL (ref 31.5–35.7)
MCV: 92 fL (ref 79–97)
Platelets: 174 10*3/uL (ref 150–450)
RBC: 4.58 x10E6/uL (ref 4.14–5.80)
RDW: 12.5 % (ref 11.6–15.4)
WBC: 6 10*3/uL (ref 3.4–10.8)

## 2020-04-03 LAB — RENAL FUNCTION PANEL
Albumin: 4.1 g/dL (ref 3.6–4.6)
BUN/Creatinine Ratio: 15 (ref 10–24)
BUN: 22 mg/dL (ref 8–27)
CO2: 23 mmol/L (ref 20–29)
Calcium: 9.1 mg/dL (ref 8.6–10.2)
Chloride: 103 mmol/L (ref 96–106)
Creatinine, Ser: 1.51 mg/dL — ABNORMAL HIGH (ref 0.76–1.27)
GFR calc Af Amer: 47 mL/min/{1.73_m2} — ABNORMAL LOW (ref >59)
GFR calc non Af Amer: 41 mL/min/{1.73_m2} — ABNORMAL LOW (ref >59)
Glucose: 97 mg/dL (ref 65–99)
Phosphorus: 3 mg/dL (ref 2.8–4.1)
Potassium: 4.3 mmol/L (ref 3.5–5.2)
Sodium: 140 mmol/L (ref 134–144)

## 2020-04-03 LAB — LIPID PANEL
Chol/HDL Ratio: 2 ratio (ref 0.0–5.0)
Cholesterol, Total: 135 mg/dL (ref 100–199)
HDL: 66 mg/dL (ref >39)
LDL Chol Calc (NIH): 56 mg/dL (ref 0–99)
Triglycerides: 59 mg/dL (ref 0–149)
VLDL Cholesterol Cal: 13 mg/dL (ref 5–40)

## 2020-04-03 LAB — VITAMIN D 25 HYDROXY (VIT D DEFICIENCY, FRACTURES): Vit D, 25-Hydroxy: 54.2 ng/mL (ref 30.0–100.0)

## 2020-04-17 ENCOUNTER — Other Ambulatory Visit: Payer: Self-pay | Admitting: Nurse Practitioner

## 2020-05-17 ENCOUNTER — Telehealth: Payer: Self-pay

## 2020-05-17 DIAGNOSIS — N183 Chronic kidney disease, stage 3 unspecified: Secondary | ICD-10-CM

## 2020-05-17 DIAGNOSIS — I129 Hypertensive chronic kidney disease with stage 1 through stage 4 chronic kidney disease, or unspecified chronic kidney disease: Secondary | ICD-10-CM

## 2020-05-17 MED ORDER — AMLODIPINE BESYLATE 5 MG PO TABS: 5.0000 mg | ORAL_TABLET | Freq: Every day | ORAL | 3 refills | Status: DC

## 2020-05-17 NOTE — Telephone Encounter (Signed)
Norvasc 5 mg has been added to his Benicar.  Hydrochlorothiazide was previously discontinued from his Benicar-hydrochlorothiazide combo because he is planning for some skin treatments.  Continue to monitor blood pressures.  I would like him to be seen in office for blood pressure recheck with nurse in 1 week, sooner if concerns arise.  If he develops any chest pain, shortness of breath, dizziness or visual disturbance he is to seek immediate medical attention

## 2020-05-17 NOTE — Telephone Encounter (Signed)
Pt called stating that he was recently put on some new BP medicine which is he has been taking as directed everyday. Pt says this morning he ate a bowl of Oatmeal and had some black coffee around 8:30 when he took his BP medicine.   Pt said he went to [facility] around 11:00 to check is BP because his machine isnt working right, and the machine at the pharmacy said his BP was 185/80.   Pt wants advise from Dr Lajuana Ripple on what to do.

## 2020-05-18 NOTE — Telephone Encounter (Signed)
Patient aware and verbalizes understanding. Appointment scheduled for 04/05 for BP check.

## 2020-05-19 ENCOUNTER — Other Ambulatory Visit: Payer: Self-pay | Admitting: Family Medicine

## 2020-05-25 ENCOUNTER — Other Ambulatory Visit: Payer: Self-pay

## 2020-05-25 ENCOUNTER — Ambulatory Visit: Payer: Medicare Other | Admitting: *Deleted

## 2020-05-25 ENCOUNTER — Telehealth: Payer: Self-pay | Admitting: *Deleted

## 2020-05-25 DIAGNOSIS — I129 Hypertensive chronic kidney disease with stage 1 through stage 4 chronic kidney disease, or unspecified chronic kidney disease: Secondary | ICD-10-CM

## 2020-05-25 NOTE — Telephone Encounter (Signed)
-----   Message from Janora Norlander, DO sent at 05/25/2020  2:32 PM EDT -----   ----- Message ----- From: Georga Kaufmann, LPN Sent: 3/0/0511   2:30 PM EDT To: Janora Norlander, DO

## 2020-05-25 NOTE — Progress Notes (Signed)
Pt in today for BP check First reading in left arm 150/60, second reading is 138/63 Reading with his machine is 168/70, second reading is 166/69 Took in right arm with pt's machine 164/68, on our machine 123/54 Pt wonders about his Covid shot causing this, & is taking stool softner since this has started are the only 2 things he has changed

## 2020-05-25 NOTE — Progress Notes (Signed)
Patient's BP cuff is NOT reliable.  Recommend replacing assuming that he is using appropriate sized cuff.  His BP is NORMAL on our machine and he does NOT need any changes to his medication.  [name] [name], [facility]

## 2020-05-25 NOTE — Telephone Encounter (Signed)
Pt aware of Dr. Marjean Donna recommendations that his BP was normal with our BP machine and that he may want to replace his machine since it was giving [place] higher readings and his cuff was of normal size

## 2020-06-01 ENCOUNTER — Other Ambulatory Visit: Payer: Self-pay | Admitting: Family Medicine

## 2020-06-01 ENCOUNTER — Encounter: Payer: Self-pay | Admitting: Family

## 2020-06-01 ENCOUNTER — Other Ambulatory Visit: Payer: Self-pay

## 2020-06-01 ENCOUNTER — Ambulatory Visit: Payer: Medicare Other | Admitting: Family

## 2020-06-01 ENCOUNTER — Ambulatory Visit (INDEPENDENT_AMBULATORY_CARE_PROVIDER_SITE_OTHER): Payer: Medicare Other | Admitting: Family

## 2020-06-01 VITALS — BP 130/52 | HR 77 | Temp 97.8°F | Ht 68.0 in | Wt 155.4 lb

## 2020-06-01 DIAGNOSIS — I10 Essential (primary) hypertension: Secondary | ICD-10-CM

## 2020-06-01 NOTE — Patient Instructions (Signed)

## 2020-06-01 NOTE — Telephone Encounter (Signed)
Last office visit today Last refill 03/29/20, 60 ml, no refills

## 2020-06-01 NOTE — Progress Notes (Signed)
Subjective:    Patient ID: Brian Blankenship, male    DOB: 10-Apr-1930, 87 y.o.   MRN: [id number]  Chief Complaint  Patient presents with  . Hypertension    Denies headaches and vision changes or dizzy only if he gets up real fast.   Pt presents to the office today with elevated BP readings at home. His HCTZ was stopped because of photosensitivity. Norvasc 5 mg was added. At home his BP has been been 160's/70's. However, in office his BP is at goal.   A comparison of his BP with our machine and his machine and it was increased 17 points on 05/25/20.  He bought a new machine and it the same as ours in office today.  Hypertension This is a chronic problem. The current episode started more than 1 year ago. The problem has been waxing and waning since onset. The problem is uncontrolled. Pertinent negatives include no blurred vision, headaches, malaise/fatigue, peripheral edema or shortness of breath. Risk factors for coronary artery disease include dyslipidemia, male gender and sedentary lifestyle. The current treatment provides mild improvement.      Review of Systems  Constitutional: Negative for malaise/fatigue.  Eyes: Negative for blurred vision.  Respiratory: Negative for shortness of breath.   Neurological: Negative for headaches.  All other systems reviewed and are negative.      Objective:   Physical Exam Vitals reviewed.  Constitutional:      General: He is not in acute distress.    Appearance: He is well-developed.  HENT:     Head: Normocephalic.     Right Ear: Tympanic membrane normal.     Left Ear: Tympanic membrane normal.  Eyes:     General:        Right eye: No discharge.        Left eye: No discharge.     Pupils: Pupils are equal, round, and reactive to light.  Neck:     Thyroid: No thyromegaly.  Cardiovascular:     Rate and Rhythm: Normal rate and regular rhythm.     Heart sounds: Normal heart sounds. No murmur heard.   Pulmonary:     Effort: Pulmonary  effort is normal. No respiratory distress.     Breath sounds: Normal breath sounds. No wheezing.  Abdominal:     General: Bowel sounds are normal. There is no distension.     Palpations: Abdomen is soft.     Tenderness: There is no abdominal tenderness.  Musculoskeletal:        General: No tenderness. Normal range of motion.     Cervical back: Normal range of motion and neck supple.  Skin:    General: Skin is warm and dry.     Findings: No erythema or rash.  Neurological:     Mental Status: He is alert and oriented to person, place, and time.     Cranial Nerves: No cranial nerve deficit.     Deep Tendon Reflexes: Reflexes are normal and symmetric.  Psychiatric:        Behavior: Behavior normal.        Thought Content: Thought content normal.        Judgment: Judgment normal.          BP (!) 130/52   Pulse 77   Temp 97.8 F (36.6 C) (Temporal)   Ht 5\' 8"  (1.727 m)   Wt 155 lb 6.4 oz (70.5 kg)   BMI 23.63 kg/m   Assessment & Plan:  Brian Blankenship  Brian Blankenship comes in today with chief complaint of Hypertension (Denies headaches and vision changes or dizzy only if he gets up real fast.)   Diagnosis and orders addressed:  1. Primary hypertension -Continue to monitor BP at home -[facility] information given -Exercise encouraged - Stress Management  -Continue current meds -RTO if symptoms worsen or do not improve and keep follow up with PCP  Evelina Dun, FNP

## 2020-06-02 ENCOUNTER — Ambulatory Visit: Payer: Medicare Other | Admitting: Nurse Practitioner

## 2020-06-24 ENCOUNTER — Other Ambulatory Visit: Payer: Self-pay | Admitting: Nurse Practitioner

## 2020-07-22 DIAGNOSIS — N200 Calculus of kidney: Secondary | ICD-10-CM | POA: Diagnosis not present

## 2020-07-22 DIAGNOSIS — N1831 Chronic kidney disease, stage 3a: Secondary | ICD-10-CM | POA: Diagnosis not present

## 2020-08-31 ENCOUNTER — Other Ambulatory Visit: Payer: Self-pay | Admitting: Family Medicine

## 2020-10-01 ENCOUNTER — Other Ambulatory Visit: Payer: Self-pay

## 2020-10-01 ENCOUNTER — Encounter: Payer: Self-pay | Admitting: Family Medicine

## 2020-10-01 ENCOUNTER — Ambulatory Visit (INDEPENDENT_AMBULATORY_CARE_PROVIDER_SITE_OTHER): Payer: Medicare Other | Admitting: Family Medicine

## 2020-10-01 VITALS — BP 140/75 | HR 67 | Temp 97.6°F | Ht 68.0 in | Wt 154.8 lb

## 2020-10-01 DIAGNOSIS — D692 Other nonthrombocytopenic purpura: Secondary | ICD-10-CM | POA: Diagnosis not present

## 2020-10-01 DIAGNOSIS — R238 Other skin changes: Secondary | ICD-10-CM | POA: Diagnosis not present

## 2020-10-01 DIAGNOSIS — I129 Hypertensive chronic kidney disease with stage 1 through stage 4 chronic kidney disease, or unspecified chronic kidney disease: Secondary | ICD-10-CM

## 2020-10-01 DIAGNOSIS — R233 Spontaneous ecchymoses: Secondary | ICD-10-CM

## 2020-10-01 DIAGNOSIS — N183 Chronic kidney disease, stage 3 unspecified: Secondary | ICD-10-CM

## 2020-10-01 DIAGNOSIS — E782 Mixed hyperlipidemia: Secondary | ICD-10-CM

## 2020-10-01 NOTE — Patient Instructions (Signed)
Ok to reduce Aspirin to 2 times per week.  Hopefully, this will improve your bruising.  Continue stool softener.  If needed, contact me and we can prescribe something should the Colace become ineffective.
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
## 2020-10-02 LAB — CBC WITH DIFFERENTIAL/PLATELET
Basophils Absolute: 0 10*3/uL (ref 0.0–0.2)
Basos: 1 %
EOS (ABSOLUTE): 0.2 10*3/uL (ref 0.0–0.4)
Eos: 3 %
Hematocrit: 43.4 % (ref 37.5–51.0)
Hemoglobin: 14.7 g/dL (ref 13.0–17.7)
Immature Grans (Abs): 0 10*3/uL (ref 0.0–0.1)
Immature Granulocytes: 0 %
Lymphocytes Absolute: 1 10*3/uL (ref 0.7–3.1)
Lymphs: 21 %
MCH: 32 pg (ref 26.6–33.0)
MCHC: 33.9 g/dL (ref 31.5–35.7)
MCV: 94 fL (ref 79–97)
Monocytes Absolute: 0.5 10*3/uL (ref 0.1–0.9)
Monocytes: 11 %
Neutrophils Absolute: 3.1 10*3/uL (ref 1.4–7.0)
Neutrophils: 64 %
Platelets: 163 10*3/uL (ref 150–450)
RBC: 4.6 x10E6/uL (ref 4.14–5.80)
RDW: 12.9 % (ref 11.6–15.4)
WBC: 4.8 10*3/uL (ref 3.4–10.8)

## 2020-10-02 LAB — RENAL FUNCTION PANEL
Albumin: 4.2 g/dL (ref 3.6–4.6)
BUN/Creatinine Ratio: 11 (ref 10–24)
BUN: 13 mg/dL (ref 8–27)
CO2: 25 mmol/L (ref 20–29)
Calcium: 9 mg/dL (ref 8.6–10.2)
Chloride: 104 mmol/L (ref 96–106)
Creatinine, Ser: 1.21 mg/dL (ref 0.76–1.27)
Glucose: 94 mg/dL (ref 65–99)
Phosphorus: 3.2 mg/dL (ref 2.8–4.1)
Potassium: 4.3 mmol/L (ref 3.5–5.2)
Sodium: 141 mmol/L (ref 134–144)
eGFR: 58 mL/min/{1.73_m2} — ABNORMAL LOW (ref >59)

## 2020-10-18 ENCOUNTER — Other Ambulatory Visit: Payer: Self-pay | Admitting: Family Medicine

## 2020-10-18 DIAGNOSIS — N401 Enlarged prostate with lower urinary tract symptoms: Secondary | ICD-10-CM

## 2020-11-05 ENCOUNTER — Ambulatory Visit (INDEPENDENT_AMBULATORY_CARE_PROVIDER_SITE_OTHER): Payer: Medicare Other

## 2020-11-05 VITALS — Ht 68.0 in | Wt 156.0 lb

## 2020-11-05 DIAGNOSIS — Z0000 Encounter for general adult medical examination without abnormal findings: Secondary | ICD-10-CM | POA: Diagnosis not present
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
## 2020-11-05 NOTE — Patient Instructions (Signed)
Brian Blankenship , Thank you for taking time to come for your Medicare Wellness Visit. I appreciate your ongoing commitment to your health goals. Please review the following plan we discussed and let me know if I can assist you in the future.   Screening recommendations/referrals: Colonoscopy: Done 02/25/2018 - no repeat required Recommended yearly ophthalmology/optometry visit for glaucoma screening and checkup Recommended yearly dental visit for hygiene and checkup  Vaccinations: Influenza vaccine: Done 11/24/2019 - Repeat annually *due Pneumococcal vaccine: Done 2003 & 2015 Tdap vaccine: Done 02/26/2014 - Repeat in 10 years Shingles vaccine: First dose done 12/29/2019 - due for second dose *get at next office visit   Covid-19: Done 03/04/19, 04/01/19, 12/12/2019, & 05/11/2020 - getting another booster this month  Advanced directives: Please bring a copy of your health care power of attorney and living will to the office to be added to your chart at your convenience.   Conditions/risks identified: Aim for 30 minutes of exercise or brisk walking each day, drink 6-8 glasses of water and eat lots of fruits and vegetables.   Next appointment: Follow up in one year for your annual wellness visit.   Preventive Care 40 Years and Older, Male  Preventive care refers to lifestyle choices and visits with your health care provider that can promote health and wellness. What does preventive care include? A yearly physical exam. This is also called an annual well check. Dental exams once or twice a year. Routine eye exams. Ask your health care provider how often you should have your eyes checked. Personal lifestyle choices, including: Daily care of your teeth and gums. Regular physical activity. Eating a healthy diet. Avoiding tobacco and drug use. Limiting alcohol use. Practicing safe sex. Taking low doses of aspirin every day. Taking vitamin and mineral supplements as recommended by your health care  provider. What happens during an annual well check? The services and screenings done by your health care provider during your annual well check will depend on your age, overall health, lifestyle risk factors, and family history of disease. Counseling  Your health care provider may ask you questions about your: Alcohol use. Tobacco use. Drug use. Emotional well-being. Home and relationship well-being. Sexual activity. Eating habits. History of falls. Memory and ability to understand (cognition). Work and work Statistician. Screening  You may have the following tests or measurements: Height, weight, and BMI. Blood pressure. Lipid and cholesterol levels. These may be checked every 5 years, or more frequently if you are over 12 years old. Skin check. Lung cancer screening. You may have this screening every year starting at age 61 if you have a 30-pack-year history of smoking and currently smoke or have quit within the past 15 years. Fecal occult blood test (FOBT) of the stool. You may have this test every year starting at age 71. Flexible sigmoidoscopy or colonoscopy. You may have a sigmoidoscopy every 5 years or a colonoscopy every 10 years starting at age 63. Prostate cancer screening. Recommendations will vary depending on your family history and other risks. Hepatitis C blood test. Hepatitis B blood test. Sexually transmitted disease (STD) testing. Diabetes screening. This is done by checking your blood sugar (glucose) after you have not eaten for a while (fasting). You may have this done every 1-3 years. Abdominal aortic aneurysm (AAA) screening. You may need this if you are a current or former smoker. Osteoporosis. You may be screened starting at age 16 if you are at high risk. Talk with your health care provider about  your test results, treatment options, and if necessary, the need for more tests. Vaccines  Your health care provider may recommend certain vaccines, such  as: Influenza vaccine. This is recommended every year. Tetanus, diphtheria, and acellular pertussis (Tdap, Td) vaccine. You may need a Td booster every 10 years. Zoster vaccine. You may need this after age 64. Pneumococcal 13-valent conjugate (PCV13) vaccine. One dose is recommended after age 37. Pneumococcal polysaccharide (PPSV23) vaccine. One dose is recommended after age 49. Talk to your health care provider about which screenings and vaccines you need and how often you need them. This information is not intended to replace advice given to you by your health care provider. Make sure you discuss any questions you have with your health care provider. Document Released: 03/05/2015 Document Revised: 10/27/2015 Document Reviewed: 12/08/2014 Elsevier Interactive Patient Education  2017 [place] Prevention in the Home Falls can cause injuries. They can happen to people of all ages. There are many things you can do to make your home safe and to help prevent falls. What can I do on the outside of my home? Regularly fix the edges of walkways and driveways and fix any cracks. Remove anything that might make you trip as you walk through a door, such as a raised step or threshold. Trim any bushes or trees on the path to your home. Use bright outdoor lighting. Clear any walking paths of anything that might make someone trip, such as rocks or tools. Regularly check to see if handrails are loose or broken. Make sure that both sides of any steps have handrails. Any raised decks and porches should have guardrails on the edges. Have any leaves, snow, or ice cleared regularly. Use sand or salt on walking paths during winter. Clean up any spills in your garage right away. This includes oil or grease spills. What can I do in the bathroom? Use night lights. Install grab bars by the toilet and in the tub and shower. Do not use towel bars as grab bars. Use non-skid mats or decals in the tub or  shower. If you need to sit down in the shower, use a plastic, non-slip stool. Keep the floor dry. Clean up any water that spills on the floor as soon as it happens. Remove soap buildup in the tub or shower regularly. Attach bath mats securely with double-sided non-slip rug tape. Do not have throw rugs and other things on the floor that can make you trip. What can I do in the bedroom? Use night lights. Make sure that you have a light by your bed that is easy to reach. Do not use any sheets or blankets that are too big for your bed. They should not hang down onto the floor. Have a firm chair that has side arms. You can use this for support while you get dressed. Do not have throw rugs and other things on the floor that can make you trip. What can I do in the kitchen? Clean up any spills right away. Avoid walking on wet floors. Keep items that you use a lot in easy-to-reach places. If you need to reach something above you, use a strong step stool that has a grab bar. Keep electrical cords out of the way. Do not use floor polish or wax that makes floors slippery. If you must use wax, use non-skid floor wax. Do not have throw rugs and other things on the floor that can make you trip. What can I do with  my stairs? Do not leave any items on the stairs. Make sure that there are handrails on both sides of the stairs and use them. Fix handrails that are broken or loose. Make sure that handrails are as long as the stairways. Check any carpeting to make sure that it is firmly attached to the stairs. Fix any carpet that is loose or worn. Avoid having throw rugs at the top or bottom of the stairs. If you do have throw rugs, attach them to the floor with carpet tape. Make sure that you have a light switch at the top of the stairs and the bottom of the stairs. If you do not have them, ask someone to add them for you. What else can I do to help prevent falls? Wear shoes that: Do not have high heels. Have  rubber bottoms. Are comfortable and fit you well. Are closed at the toe. Do not wear sandals. If you use a stepladder: Make sure that it is fully opened. Do not climb a closed stepladder. Make sure that both sides of the stepladder are locked into place. Ask someone to hold it for you, if possible. Clearly mark and make sure that you can see: Any grab bars or handrails. First and last steps. Where the edge of each step is. Use tools that help you move around (mobility aids) if they are needed. These include: Canes. Walkers. Scooters. Crutches. Turn on the lights when you go into a dark area. Replace any light bulbs as soon as they burn out. Set up your furniture so you have a clear path. Avoid moving your furniture around. If any of your floors are uneven, fix them. If there are any pets around you, be aware of where they are. Review your medicines with your doctor. Some medicines can make you feel dizzy. This can increase your chance of falling. Ask your doctor what other things that you can do to help prevent falls. This information is not intended to replace advice given to you by your health care provider. Make sure you discuss any questions you have with your health care provider. Document Released: 12/03/2008 Document Revised: 07/15/2015 Document Reviewed: 03/13/2014 Elsevier Interactive Patient Education  2017 [employer].

## 2020-11-12 ENCOUNTER — Other Ambulatory Visit: Payer: Self-pay | Admitting: Family Medicine

## 2020-11-12 DIAGNOSIS — E785 Hyperlipidemia, unspecified: Secondary | ICD-10-CM

## 2020-11-22 ENCOUNTER — Other Ambulatory Visit: Payer: Self-pay | Admitting: Family Medicine

## 2021-01-18 DIAGNOSIS — K648 Other hemorrhoids: Secondary | ICD-10-CM | POA: Diagnosis not present

## 2021-01-18 DIAGNOSIS — K5900 Constipation, unspecified: Secondary | ICD-10-CM | POA: Diagnosis not present

## 2021-01-25 DIAGNOSIS — B351 Tinea unguium: Secondary | ICD-10-CM | POA: Diagnosis not present

## 2021-01-25 DIAGNOSIS — M79676 Pain in unspecified toe(s): Secondary | ICD-10-CM | POA: Diagnosis not present

## 2021-03-02 ENCOUNTER — Other Ambulatory Visit: Payer: Self-pay | Admitting: Family Medicine

## 2021-03-02 NOTE — Telephone Encounter (Signed)
Last refill was given 11/23/20  Ok to refill

## 2021-03-03 DIAGNOSIS — N200 Calculus of kidney: Secondary | ICD-10-CM | POA: Diagnosis not present

## 2021-03-03 DIAGNOSIS — N1831 Chronic kidney disease, stage 3a: Secondary | ICD-10-CM | POA: Diagnosis not present

## 2021-04-04 ENCOUNTER — Encounter: Payer: Self-pay | Admitting: Family Medicine

## 2021-04-04 ENCOUNTER — Ambulatory Visit (INDEPENDENT_AMBULATORY_CARE_PROVIDER_SITE_OTHER): Payer: Medicare Other | Admitting: Family Medicine

## 2021-04-04 VITALS — BP 150/73 | HR 87 | Temp 98.7°F | Ht 68.0 in | Wt 157.2 lb

## 2021-04-04 DIAGNOSIS — Z0000 Encounter for general adult medical examination without abnormal findings: Secondary | ICD-10-CM

## 2021-04-04 DIAGNOSIS — N401 Enlarged prostate with lower urinary tract symptoms: Secondary | ICD-10-CM

## 2021-04-04 DIAGNOSIS — Z23 Encounter for immunization: Secondary | ICD-10-CM | POA: Diagnosis not present

## 2021-04-04 DIAGNOSIS — R351 Nocturia: Secondary | ICD-10-CM

## 2021-04-04 DIAGNOSIS — E782 Mixed hyperlipidemia: Secondary | ICD-10-CM

## 2021-04-04 DIAGNOSIS — D692 Other nonthrombocytopenic purpura: Secondary | ICD-10-CM

## 2021-04-04 DIAGNOSIS — I129 Hypertensive chronic kidney disease with stage 1 through stage 4 chronic kidney disease, or unspecified chronic kidney disease: Secondary | ICD-10-CM | POA: Diagnosis not present

## 2021-04-04 DIAGNOSIS — N183 Chronic kidney disease, stage 3 unspecified: Secondary | ICD-10-CM | POA: Diagnosis not present

## 2021-04-04 DIAGNOSIS — Z0001 Encounter for general adult medical examination with abnormal findings: Secondary | ICD-10-CM | POA: Diagnosis not present

## 2021-04-04 DIAGNOSIS — H9193 Unspecified hearing loss, bilateral: Secondary | ICD-10-CM | POA: Diagnosis not present

## 2021-04-04 MED ORDER — ATORVASTATIN CALCIUM 20 MG PO TABS: 20.0000 mg | ORAL_TABLET | Freq: Every day | ORAL | 3 refills | Status: DC

## 2021-04-04 MED ORDER — CLOBETASOL PROPIONATE 0.05 % EX OINT: TOPICAL_OINTMENT | Freq: Two times a day (BID) | CUTANEOUS | 2 refills | Status: DC | PRN

## 2021-04-04 MED ORDER — OLMESARTAN MEDOXOMIL 40 MG PO TABS: 40.0000 mg | ORAL_TABLET | Freq: Every day | ORAL | 3 refills | Status: DC

## 2021-04-04 MED ORDER — ALLOPURINOL 100 MG PO TABS: 100.0000 mg | ORAL_TABLET | Freq: Every day | ORAL | 3 refills | Status: DC

## 2021-04-04 MED ORDER — AMLODIPINE BESYLATE 5 MG PO TABS: 5.0000 mg | ORAL_TABLET | Freq: Every day | ORAL | 3 refills | Status: DC

## 2021-04-04 NOTE — Patient Instructions (Signed)
You had labs performed today.  You will be contacted with the results of the labs once they are available, usually in the next 3 business days for routine lab work.  If you have an active my chart account, they will be released to your MyChart.  If you prefer to have these labs released to you via telephone, please let us know.  Preventive Care 29 Years and Older, Male Preventive care refers to lifestyle choices and visits with your health care provider that can promote health and wellness. Preventive care visits are also called wellness exams. What can I expect for my preventive care visit? Counseling During your preventive care visit, your health care provider may ask about your: Medical history, including: Past medical problems. Family medical history. History of falls. Current health, including: Emotional well-being. Home life and relationship well-being. Sexual activity. Memory and ability to understand (cognition). Lifestyle, including: Alcohol, nicotine or tobacco, and drug use. Access to firearms. Diet, exercise, and sleep habits. Work and work Statistician. Sunscreen use. Safety issues such as seatbelt and bike helmet use. Physical exam Your health care provider will check your: Height and weight. These may be used to calculate your BMI (body mass index). BMI is a measurement that tells if you are at a healthy weight. Waist circumference. This measures the distance around your waistline. This measurement also tells if you are at a healthy weight and may help predict your risk of certain diseases, such as type 2 diabetes and high blood pressure. Heart rate and blood pressure. Body temperature. Skin for abnormal spots. What immunizations do I need? Vaccines are usually given at various ages, according to a schedule. Your health care provider will recommend vaccines for you based on your age, medical history, and lifestyle or other factors, such as travel or where you work. What  tests do I need? Screening Your health care provider may recommend screening tests for certain conditions. This may include: Lipid and cholesterol levels. Diabetes screening. This is done by checking your blood sugar (glucose) after you have not eaten for a while (fasting). Hepatitis C test. Hepatitis B test. HIV (human immunodeficiency virus) test. STI (sexually transmitted infection) testing, if you are at risk. Lung cancer screening. Colorectal cancer screening. Prostate cancer screening. Abdominal aortic aneurysm (AAA) screening. You may need this if you are a current or former smoker. Talk with your health care provider about your test results, treatment options, and if necessary, the need for more tests. Follow these instructions at home: Eating and drinking  Eat a diet that includes fresh fruits and vegetables, whole grains, lean protein, and low-fat dairy products. Limit your intake of foods with high amounts of sugar, saturated fats, and salt. Take vitamin and mineral supplements as recommended by your health care provider. Do not drink alcohol if your health care provider tells you not to drink. If you drink alcohol: Limit how much you have to 0-2 drinks a day. Know how much alcohol is in your drink. In the [place] one drink equals one 12 oz bottle of beer (355 mL), one 5 oz glass of wine (148 mL), or one 1 oz glass of hard liquor (44 mL). Lifestyle Brush your teeth every morning and night with fluoride toothpaste. Floss one time each day. Exercise for at least 30 minutes 5 or more days each week. Do not use any products that contain nicotine or tobacco. These products include cigarettes, chewing tobacco, and vaping devices, such as e-cigarettes. If you need help quitting, ask  your health care provider. Do not use drugs. If you are sexually active, practice safe sex. Use a condom or other form of protection to prevent STIs. Take aspirin only as told by your health care provider.  Make sure that you understand how much to take and what form to take. Work with your health care provider to find out whether it is safe and beneficial for you to take aspirin daily. Ask your health care provider if you need to take a cholesterol-lowering medicine (statin). Find healthy ways to manage stress, such as: Meditation, yoga, or listening to music. Journaling. Talking to a trusted person. Spending time with friends and family. Safety Always wear your seat belt while driving or riding in a vehicle. Do not drive: If you have been drinking alcohol. Do not ride with someone who has been drinking. When you are tired or distracted. While texting. If you have been using any mind-altering substances or drugs. Wear a helmet and other protective equipment during sports activities. If you have firearms in your house, make sure you follow all gun safety procedures. Minimize exposure to UV radiation to reduce your risk of skin cancer. What's next? Visit your health care provider once a year for an annual wellness visit. Ask your health care provider how often you should have your eyes and teeth checked. Stay up to date on all vaccines. This information is not intended to replace advice given to you by your health care provider. Make sure you discuss any questions you have with your health care provider. Document Revised: 08/04/2020 Document Reviewed: 08/04/2020 Elsevier Patient Education  [place].
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
## 2021-04-05 LAB — CBC
Hematocrit: 43 % (ref 37.5–51.0)
Hemoglobin: 14.8 g/dL (ref 13.0–17.7)
MCH: 32 pg (ref 26.6–33.0)
MCHC: 34.4 g/dL (ref 31.5–35.7)
MCV: 93 fL (ref 79–97)
Platelets: 169 10*3/uL (ref 150–450)
RBC: 4.62 x10E6/uL (ref 4.14–5.80)
RDW: 12.1 % (ref 11.6–15.4)
WBC: 4.7 10*3/uL (ref 3.4–10.8)

## 2021-04-05 LAB — CMP14+EGFR
ALT: 31 IU/L (ref 0–44)
AST: 34 IU/L (ref 0–40)
Albumin/Globulin Ratio: 2.3 — ABNORMAL HIGH (ref 1.2–2.2)
Albumin: 4.3 g/dL (ref 3.6–4.6)
Alkaline Phosphatase: 68 IU/L (ref 44–121)
BUN/Creatinine Ratio: 12 (ref 10–24)
BUN: 14 mg/dL (ref 8–27)
Bilirubin Total: 0.9 mg/dL (ref 0.0–1.2)
CO2: 25 mmol/L (ref 20–29)
Calcium: 9.5 mg/dL (ref 8.6–10.2)
Chloride: 104 mmol/L (ref 96–106)
Creatinine, Ser: 1.19 mg/dL (ref 0.76–1.27)
Globulin, Total: 1.9 g/dL (ref 1.5–4.5)
Glucose: 102 mg/dL — ABNORMAL HIGH (ref 70–99)
Potassium: 4.5 mmol/L (ref 3.5–5.2)
Sodium: 141 mmol/L (ref 134–144)
Total Protein: 6.2 g/dL (ref 6.0–8.5)
eGFR: 59 mL/min/{1.73_m2} — ABNORMAL LOW (ref >59)

## 2021-04-05 LAB — PSA: Prostate Specific Ag, Serum: 1 ng/mL (ref 0.0–4.0)

## 2021-04-05 LAB — VITAMIN D 25 HYDROXY (VIT D DEFICIENCY, FRACTURES): Vit D, 25-Hydroxy: 53.9 ng/mL (ref 30.0–100.0)

## 2021-04-05 LAB — LIPID PANEL
Chol/HDL Ratio: 2 ratio (ref 0.0–5.0)
Cholesterol, Total: 138 mg/dL (ref 100–199)
HDL: 68 mg/dL (ref >39)
LDL Chol Calc (NIH): 57 mg/dL (ref 0–99)
Triglycerides: 61 mg/dL (ref 0–149)
VLDL Cholesterol Cal: 13 mg/dL (ref 5–40)

## 2021-04-05 LAB — TSH: TSH: 1.4 u[IU]/mL (ref 0.450–4.500)

## 2021-05-24 DIAGNOSIS — M79676 Pain in unspecified toe(s): Secondary | ICD-10-CM | POA: Diagnosis not present

## 2021-05-24 DIAGNOSIS — B351 Tinea unguium: Secondary | ICD-10-CM | POA: Diagnosis not present

## 2021-07-04 DIAGNOSIS — M9903 Segmental and somatic dysfunction of lumbar region: Secondary | ICD-10-CM | POA: Diagnosis not present

## 2021-07-04 DIAGNOSIS — M62830 Muscle spasm of back: Secondary | ICD-10-CM | POA: Diagnosis not present

## 2021-07-04 DIAGNOSIS — M9905 Segmental and somatic dysfunction of pelvic region: Secondary | ICD-10-CM | POA: Diagnosis not present

## 2021-07-04 DIAGNOSIS — M9904 Segmental and somatic dysfunction of sacral region: Secondary | ICD-10-CM | POA: Diagnosis not present

## 2021-07-06 DIAGNOSIS — M9904 Segmental and somatic dysfunction of sacral region: Secondary | ICD-10-CM | POA: Diagnosis not present

## 2021-07-06 DIAGNOSIS — M9905 Segmental and somatic dysfunction of pelvic region: Secondary | ICD-10-CM | POA: Diagnosis not present

## 2021-07-06 DIAGNOSIS — M9903 Segmental and somatic dysfunction of lumbar region: Secondary | ICD-10-CM | POA: Diagnosis not present

## 2021-07-06 DIAGNOSIS — M62830 Muscle spasm of back: Secondary | ICD-10-CM | POA: Diagnosis not present

## 2021-07-07 DIAGNOSIS — M9905 Segmental and somatic dysfunction of pelvic region: Secondary | ICD-10-CM | POA: Diagnosis not present

## 2021-07-07 DIAGNOSIS — M9903 Segmental and somatic dysfunction of lumbar region: Secondary | ICD-10-CM | POA: Diagnosis not present

## 2021-07-07 DIAGNOSIS — M9904 Segmental and somatic dysfunction of sacral region: Secondary | ICD-10-CM | POA: Diagnosis not present

## 2021-07-07 DIAGNOSIS — M62830 Muscle spasm of back: Secondary | ICD-10-CM | POA: Diagnosis not present

## 2021-07-11 DIAGNOSIS — M9905 Segmental and somatic dysfunction of pelvic region: Secondary | ICD-10-CM | POA: Diagnosis not present

## 2021-07-11 DIAGNOSIS — M9903 Segmental and somatic dysfunction of lumbar region: Secondary | ICD-10-CM | POA: Diagnosis not present

## 2021-07-11 DIAGNOSIS — M9904 Segmental and somatic dysfunction of sacral region: Secondary | ICD-10-CM | POA: Diagnosis not present

## 2021-07-11 DIAGNOSIS — M62830 Muscle spasm of back: Secondary | ICD-10-CM | POA: Diagnosis not present

## 2021-07-13 DIAGNOSIS — M9904 Segmental and somatic dysfunction of sacral region: Secondary | ICD-10-CM | POA: Diagnosis not present

## 2021-07-13 DIAGNOSIS — M62830 Muscle spasm of back: Secondary | ICD-10-CM | POA: Diagnosis not present

## 2021-07-13 DIAGNOSIS — M9903 Segmental and somatic dysfunction of lumbar region: Secondary | ICD-10-CM | POA: Diagnosis not present

## 2021-07-13 DIAGNOSIS — M9905 Segmental and somatic dysfunction of pelvic region: Secondary | ICD-10-CM | POA: Diagnosis not present

## 2021-07-14 DIAGNOSIS — M9903 Segmental and somatic dysfunction of lumbar region: Secondary | ICD-10-CM | POA: Diagnosis not present

## 2021-07-14 DIAGNOSIS — M9904 Segmental and somatic dysfunction of sacral region: Secondary | ICD-10-CM | POA: Diagnosis not present

## 2021-07-14 DIAGNOSIS — M9905 Segmental and somatic dysfunction of pelvic region: Secondary | ICD-10-CM | POA: Diagnosis not present

## 2021-07-14 DIAGNOSIS — M62830 Muscle spasm of back: Secondary | ICD-10-CM | POA: Diagnosis not present

## 2021-07-19 DIAGNOSIS — M62830 Muscle spasm of back: Secondary | ICD-10-CM | POA: Diagnosis not present

## 2021-07-19 DIAGNOSIS — M9904 Segmental and somatic dysfunction of sacral region: Secondary | ICD-10-CM | POA: Diagnosis not present

## 2021-07-19 DIAGNOSIS — M9905 Segmental and somatic dysfunction of pelvic region: Secondary | ICD-10-CM | POA: Diagnosis not present

## 2021-07-19 DIAGNOSIS — M9903 Segmental and somatic dysfunction of lumbar region: Secondary | ICD-10-CM | POA: Diagnosis not present

## 2021-07-21 DIAGNOSIS — M9904 Segmental and somatic dysfunction of sacral region: Secondary | ICD-10-CM | POA: Diagnosis not present

## 2021-07-21 DIAGNOSIS — M9903 Segmental and somatic dysfunction of lumbar region: Secondary | ICD-10-CM | POA: Diagnosis not present

## 2021-07-21 DIAGNOSIS — M62830 Muscle spasm of back: Secondary | ICD-10-CM | POA: Diagnosis not present

## 2021-07-21 DIAGNOSIS — M9905 Segmental and somatic dysfunction of pelvic region: Secondary | ICD-10-CM | POA: Diagnosis not present

## 2021-07-25 DIAGNOSIS — M9904 Segmental and somatic dysfunction of sacral region: Secondary | ICD-10-CM | POA: Diagnosis not present

## 2021-07-25 DIAGNOSIS — M9903 Segmental and somatic dysfunction of lumbar region: Secondary | ICD-10-CM | POA: Diagnosis not present

## 2021-07-25 DIAGNOSIS — M62830 Muscle spasm of back: Secondary | ICD-10-CM | POA: Diagnosis not present

## 2021-07-25 DIAGNOSIS — M9905 Segmental and somatic dysfunction of pelvic region: Secondary | ICD-10-CM | POA: Diagnosis not present

## 2021-07-27 DIAGNOSIS — M62830 Muscle spasm of back: Secondary | ICD-10-CM | POA: Diagnosis not present

## 2021-07-27 DIAGNOSIS — M9904 Segmental and somatic dysfunction of sacral region: Secondary | ICD-10-CM | POA: Diagnosis not present

## 2021-07-27 DIAGNOSIS — M9905 Segmental and somatic dysfunction of pelvic region: Secondary | ICD-10-CM | POA: Diagnosis not present

## 2021-07-27 DIAGNOSIS — M9903 Segmental and somatic dysfunction of lumbar region: Secondary | ICD-10-CM | POA: Diagnosis not present

## 2021-08-01 DIAGNOSIS — M62830 Muscle spasm of back: Secondary | ICD-10-CM | POA: Diagnosis not present

## 2021-08-01 DIAGNOSIS — M9903 Segmental and somatic dysfunction of lumbar region: Secondary | ICD-10-CM | POA: Diagnosis not present

## 2021-08-01 DIAGNOSIS — M9905 Segmental and somatic dysfunction of pelvic region: Secondary | ICD-10-CM | POA: Diagnosis not present

## 2021-08-01 DIAGNOSIS — M9904 Segmental and somatic dysfunction of sacral region: Secondary | ICD-10-CM | POA: Diagnosis not present

## 2021-08-04 DIAGNOSIS — M9905 Segmental and somatic dysfunction of pelvic region: Secondary | ICD-10-CM | POA: Diagnosis not present

## 2021-08-04 DIAGNOSIS — M9903 Segmental and somatic dysfunction of lumbar region: Secondary | ICD-10-CM | POA: Diagnosis not present

## 2021-08-04 DIAGNOSIS — M9904 Segmental and somatic dysfunction of sacral region: Secondary | ICD-10-CM | POA: Diagnosis not present

## 2021-08-04 DIAGNOSIS — M62830 Muscle spasm of back: Secondary | ICD-10-CM | POA: Diagnosis not present

## 2021-08-10 DIAGNOSIS — M9905 Segmental and somatic dysfunction of pelvic region: Secondary | ICD-10-CM | POA: Diagnosis not present

## 2021-08-10 DIAGNOSIS — M62830 Muscle spasm of back: Secondary | ICD-10-CM | POA: Diagnosis not present

## 2021-08-10 DIAGNOSIS — M9904 Segmental and somatic dysfunction of sacral region: Secondary | ICD-10-CM | POA: Diagnosis not present

## 2021-08-10 DIAGNOSIS — M9903 Segmental and somatic dysfunction of lumbar region: Secondary | ICD-10-CM | POA: Diagnosis not present

## 2021-08-22 DIAGNOSIS — M9905 Segmental and somatic dysfunction of pelvic region: Secondary | ICD-10-CM | POA: Diagnosis not present

## 2021-08-22 DIAGNOSIS — M9903 Segmental and somatic dysfunction of lumbar region: Secondary | ICD-10-CM | POA: Diagnosis not present

## 2021-08-22 DIAGNOSIS — M62830 Muscle spasm of back: Secondary | ICD-10-CM | POA: Diagnosis not present

## 2021-08-22 DIAGNOSIS — M9904 Segmental and somatic dysfunction of sacral region: Secondary | ICD-10-CM | POA: Diagnosis not present

## 2021-08-29 DIAGNOSIS — M62830 Muscle spasm of back: Secondary | ICD-10-CM | POA: Diagnosis not present

## 2021-08-29 DIAGNOSIS — M9904 Segmental and somatic dysfunction of sacral region: Secondary | ICD-10-CM | POA: Diagnosis not present

## 2021-08-29 DIAGNOSIS — M9905 Segmental and somatic dysfunction of pelvic region: Secondary | ICD-10-CM | POA: Diagnosis not present

## 2021-08-29 DIAGNOSIS — M9903 Segmental and somatic dysfunction of lumbar region: Secondary | ICD-10-CM | POA: Diagnosis not present

## 2021-09-02 ENCOUNTER — Other Ambulatory Visit: Payer: Self-pay | Admitting: Family Medicine

## 2021-09-05 DIAGNOSIS — M9905 Segmental and somatic dysfunction of pelvic region: Secondary | ICD-10-CM | POA: Diagnosis not present

## 2021-09-05 DIAGNOSIS — M9904 Segmental and somatic dysfunction of sacral region: Secondary | ICD-10-CM | POA: Diagnosis not present

## 2021-09-05 DIAGNOSIS — M62830 Muscle spasm of back: Secondary | ICD-10-CM | POA: Diagnosis not present

## 2021-09-05 DIAGNOSIS — M9903 Segmental and somatic dysfunction of lumbar region: Secondary | ICD-10-CM | POA: Diagnosis not present

## 2021-09-19 DIAGNOSIS — M62830 Muscle spasm of back: Secondary | ICD-10-CM | POA: Diagnosis not present

## 2021-09-19 DIAGNOSIS — M9905 Segmental and somatic dysfunction of pelvic region: Secondary | ICD-10-CM | POA: Diagnosis not present

## 2021-09-19 DIAGNOSIS — M9904 Segmental and somatic dysfunction of sacral region: Secondary | ICD-10-CM | POA: Diagnosis not present

## 2021-09-19 DIAGNOSIS — M9903 Segmental and somatic dysfunction of lumbar region: Secondary | ICD-10-CM | POA: Diagnosis not present

## 2021-10-03 ENCOUNTER — Ambulatory Visit (INDEPENDENT_AMBULATORY_CARE_PROVIDER_SITE_OTHER): Payer: Medicare Other | Admitting: Family Medicine

## 2021-10-03 ENCOUNTER — Encounter: Payer: Self-pay | Admitting: Family Medicine

## 2021-10-03 VITALS — BP 139/68 | HR 81 | Temp 97.8°F | Ht 68.0 in | Wt 156.4 lb

## 2021-10-03 DIAGNOSIS — D692 Other nonthrombocytopenic purpura: Secondary | ICD-10-CM | POA: Diagnosis not present

## 2021-10-03 DIAGNOSIS — I129 Hypertensive chronic kidney disease with stage 1 through stage 4 chronic kidney disease, or unspecified chronic kidney disease: Secondary | ICD-10-CM | POA: Diagnosis not present

## 2021-10-03 DIAGNOSIS — N401 Enlarged prostate with lower urinary tract symptoms: Secondary | ICD-10-CM | POA: Diagnosis not present

## 2021-10-03 DIAGNOSIS — N183 Chronic kidney disease, stage 3 unspecified: Secondary | ICD-10-CM

## 2021-10-03 DIAGNOSIS — R351 Nocturia: Secondary | ICD-10-CM
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
## 2021-10-04 LAB — RENAL FUNCTION PANEL
Albumin: 4.2 g/dL (ref 3.7–4.7)
BUN/Creatinine Ratio: 12 (ref 10–24)
BUN: 16 mg/dL (ref 8–27)
CO2: 23 mmol/L (ref 20–29)
Calcium: 9.3 mg/dL (ref 8.6–10.2)
Chloride: 104 mmol/L (ref 96–106)
Creatinine, Ser: 1.35 mg/dL — ABNORMAL HIGH (ref 0.76–1.27)
Glucose: 96 mg/dL (ref 70–99)
Phosphorus: 3.2 mg/dL (ref 2.8–4.1)
Potassium: 4.7 mmol/L (ref 3.5–5.2)
Sodium: 140 mmol/L (ref 134–144)
eGFR: 50 mL/min/{1.73_m2} — ABNORMAL LOW (ref >59)

## 2021-10-07 ENCOUNTER — Encounter: Payer: Self-pay | Admitting: Family Medicine

## 2021-10-10 DIAGNOSIS — M9905 Segmental and somatic dysfunction of pelvic region: Secondary | ICD-10-CM | POA: Diagnosis not present

## 2021-10-10 DIAGNOSIS — M9903 Segmental and somatic dysfunction of lumbar region: Secondary | ICD-10-CM | POA: Diagnosis not present

## 2021-10-10 DIAGNOSIS — M9904 Segmental and somatic dysfunction of sacral region: Secondary | ICD-10-CM | POA: Diagnosis not present

## 2021-10-10 DIAGNOSIS — M62830 Muscle spasm of back: Secondary | ICD-10-CM | POA: Diagnosis not present

## 2021-10-25 ENCOUNTER — Other Ambulatory Visit: Payer: Self-pay | Admitting: Family Medicine

## 2021-10-25 DIAGNOSIS — N401 Enlarged prostate with lower urinary tract symptoms: Secondary | ICD-10-CM

## 2021-10-26 DIAGNOSIS — M9905 Segmental and somatic dysfunction of pelvic region: Secondary | ICD-10-CM | POA: Diagnosis not present

## 2021-10-26 DIAGNOSIS — M62830 Muscle spasm of back: Secondary | ICD-10-CM | POA: Diagnosis not present

## 2021-10-26 DIAGNOSIS — M9904 Segmental and somatic dysfunction of sacral region: Secondary | ICD-10-CM | POA: Diagnosis not present

## 2021-10-26 DIAGNOSIS — M9903 Segmental and somatic dysfunction of lumbar region: Secondary | ICD-10-CM | POA: Diagnosis not present

## 2021-10-27 DIAGNOSIS — B351 Tinea unguium: Secondary | ICD-10-CM | POA: Diagnosis not present

## 2021-10-27 DIAGNOSIS — M79676 Pain in unspecified toe(s): Secondary | ICD-10-CM | POA: Diagnosis not present

## 2021-11-07 DIAGNOSIS — M9905 Segmental and somatic dysfunction of pelvic region: Secondary | ICD-10-CM | POA: Diagnosis not present

## 2021-11-07 DIAGNOSIS — M9903 Segmental and somatic dysfunction of lumbar region: Secondary | ICD-10-CM | POA: Diagnosis not present

## 2021-11-07 DIAGNOSIS — M9904 Segmental and somatic dysfunction of sacral region: Secondary | ICD-10-CM | POA: Diagnosis not present

## 2021-11-07 DIAGNOSIS — M62830 Muscle spasm of back: Secondary | ICD-10-CM | POA: Diagnosis not present

## 2021-11-08 ENCOUNTER — Ambulatory Visit (INDEPENDENT_AMBULATORY_CARE_PROVIDER_SITE_OTHER): Payer: Medicare Other

## 2021-11-08 DIAGNOSIS — Z0000 Encounter for general adult medical examination without abnormal findings: Secondary | ICD-10-CM
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
## 2021-12-05 ENCOUNTER — Ambulatory Visit (INDEPENDENT_AMBULATORY_CARE_PROVIDER_SITE_OTHER): Payer: Medicare Other | Admitting: Family Medicine

## 2021-12-05 ENCOUNTER — Ambulatory Visit (INDEPENDENT_AMBULATORY_CARE_PROVIDER_SITE_OTHER): Payer: Medicare Other

## 2021-12-05 ENCOUNTER — Encounter: Payer: Self-pay | Admitting: Family Medicine

## 2021-12-05 VITALS — BP 144/63 | HR 78 | Temp 97.3°F | Ht 68.0 in | Wt 157.2 lb

## 2021-12-05 DIAGNOSIS — J302 Other seasonal allergic rhinitis: Secondary | ICD-10-CM | POA: Diagnosis not present

## 2021-12-05 DIAGNOSIS — S2231XA Fracture of one rib, right side, initial encounter for closed fracture: Secondary | ICD-10-CM | POA: Diagnosis not present

## 2021-12-05 DIAGNOSIS — R053 Chronic cough: Secondary | ICD-10-CM

## 2021-12-05 DIAGNOSIS — M9905 Segmental and somatic dysfunction of pelvic region: Secondary | ICD-10-CM | POA: Diagnosis not present

## 2021-12-05 DIAGNOSIS — R059 Cough, unspecified: Secondary | ICD-10-CM | POA: Diagnosis not present

## 2021-12-05 DIAGNOSIS — M9904 Segmental and somatic dysfunction of sacral region: Secondary | ICD-10-CM | POA: Diagnosis not present

## 2021-12-05 DIAGNOSIS — M62830 Muscle spasm of back: Secondary | ICD-10-CM | POA: Diagnosis not present

## 2021-12-05 DIAGNOSIS — M9903 Segmental and somatic dysfunction of lumbar region: Secondary | ICD-10-CM | POA: Diagnosis not present

## 2021-12-05 MED ORDER — MONTELUKAST SODIUM 10 MG PO TABS: 10.0000 mg | ORAL_TABLET | Freq: Every day | ORAL | 3 refills | Status: DC

## 2021-12-05 NOTE — Patient Instructions (Signed)
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
## 2022-01-02 DIAGNOSIS — M9905 Segmental and somatic dysfunction of pelvic region: Secondary | ICD-10-CM | POA: Diagnosis not present

## 2022-01-02 DIAGNOSIS — M62830 Muscle spasm of back: Secondary | ICD-10-CM | POA: Diagnosis not present

## 2022-01-02 DIAGNOSIS — M9904 Segmental and somatic dysfunction of sacral region: Secondary | ICD-10-CM | POA: Diagnosis not present

## 2022-01-02 DIAGNOSIS — M9903 Segmental and somatic dysfunction of lumbar region: Secondary | ICD-10-CM | POA: Diagnosis not present

## 2022-01-28 ENCOUNTER — Other Ambulatory Visit: Payer: Self-pay | Admitting: Family Medicine

## 2022-01-28 DIAGNOSIS — N401 Enlarged prostate with lower urinary tract symptoms: Secondary | ICD-10-CM

## 2022-01-31 DIAGNOSIS — M9903 Segmental and somatic dysfunction of lumbar region: Secondary | ICD-10-CM | POA: Diagnosis not present

## 2022-01-31 DIAGNOSIS — M9904 Segmental and somatic dysfunction of sacral region: Secondary | ICD-10-CM | POA: Diagnosis not present

## 2022-01-31 DIAGNOSIS — M9905 Segmental and somatic dysfunction of pelvic region: Secondary | ICD-10-CM | POA: Diagnosis not present

## 2022-01-31 DIAGNOSIS — M62830 Muscle spasm of back: Secondary | ICD-10-CM | POA: Diagnosis not present

## 2022-02-27 DIAGNOSIS — M9905 Segmental and somatic dysfunction of pelvic region: Secondary | ICD-10-CM | POA: Diagnosis not present

## 2022-02-27 DIAGNOSIS — M62830 Muscle spasm of back: Secondary | ICD-10-CM | POA: Diagnosis not present

## 2022-02-27 DIAGNOSIS — M9903 Segmental and somatic dysfunction of lumbar region: Secondary | ICD-10-CM | POA: Diagnosis not present

## 2022-02-27 DIAGNOSIS — M9904 Segmental and somatic dysfunction of sacral region: Secondary | ICD-10-CM | POA: Diagnosis not present

## 2022-03-02 DIAGNOSIS — M79676 Pain in unspecified toe(s): Secondary | ICD-10-CM | POA: Diagnosis not present

## 2022-03-02 DIAGNOSIS — B351 Tinea unguium: Secondary | ICD-10-CM | POA: Diagnosis not present

## 2022-03-09 DIAGNOSIS — N200 Calculus of kidney: Secondary | ICD-10-CM | POA: Diagnosis not present

## 2022-03-09 DIAGNOSIS — N1831 Chronic kidney disease, stage 3a: Secondary | ICD-10-CM | POA: Diagnosis not present

## 2022-03-27 DIAGNOSIS — M9905 Segmental and somatic dysfunction of pelvic region: Secondary | ICD-10-CM | POA: Diagnosis not present

## 2022-03-27 DIAGNOSIS — M62830 Muscle spasm of back: Secondary | ICD-10-CM | POA: Diagnosis not present

## 2022-03-27 DIAGNOSIS — M9903 Segmental and somatic dysfunction of lumbar region: Secondary | ICD-10-CM | POA: Diagnosis not present

## 2022-03-27 DIAGNOSIS — M9904 Segmental and somatic dysfunction of sacral region: Secondary | ICD-10-CM | POA: Diagnosis not present

## 2022-04-03 ENCOUNTER — Other Ambulatory Visit: Payer: Self-pay | Admitting: Family Medicine

## 2022-04-04 NOTE — Telephone Encounter (Signed)
Last office visit 10/03/21 Upcoming appointment 04/10/22 Last refill 04/04/21, 60 grams, 2 refills

## 2022-04-10 ENCOUNTER — Ambulatory Visit (INDEPENDENT_AMBULATORY_CARE_PROVIDER_SITE_OTHER): Payer: Medicare Other | Admitting: Family Medicine

## 2022-04-10 ENCOUNTER — Encounter: Payer: Self-pay | Admitting: Family Medicine

## 2022-04-10 VITALS — BP 143/67 | HR 77 | Temp 97.4°F | Resp 20 | Ht 68.0 in | Wt 159.0 lb

## 2022-04-10 DIAGNOSIS — Z0001 Encounter for general adult medical examination with abnormal findings: Secondary | ICD-10-CM

## 2022-04-10 DIAGNOSIS — Z658 Other specified problems related to psychosocial circumstances: Secondary | ICD-10-CM | POA: Insufficient documentation

## 2022-04-10 DIAGNOSIS — I480 Paroxysmal atrial fibrillation: Secondary | ICD-10-CM | POA: Diagnosis not present

## 2022-04-10 DIAGNOSIS — N401 Enlarged prostate with lower urinary tract symptoms: Secondary | ICD-10-CM

## 2022-04-10 DIAGNOSIS — R351 Nocturia: Secondary | ICD-10-CM | POA: Diagnosis not present

## 2022-04-10 DIAGNOSIS — E559 Vitamin D deficiency, unspecified: Secondary | ICD-10-CM | POA: Diagnosis not present

## 2022-04-10 DIAGNOSIS — I129 Hypertensive chronic kidney disease with stage 1 through stage 4 chronic kidney disease, or unspecified chronic kidney disease: Secondary | ICD-10-CM | POA: Diagnosis not present

## 2022-04-10 DIAGNOSIS — J3089 Other allergic rhinitis: Secondary | ICD-10-CM

## 2022-04-10 DIAGNOSIS — N183 Chronic kidney disease, stage 3 unspecified: Secondary | ICD-10-CM | POA: Diagnosis not present

## 2022-04-10 DIAGNOSIS — I700 Atherosclerosis of aorta: Secondary | ICD-10-CM

## 2022-04-10 DIAGNOSIS — Z0000 Encounter for general adult medical examination without abnormal findings: Secondary | ICD-10-CM

## 2022-04-10 DIAGNOSIS — E782 Mixed hyperlipidemia: Secondary | ICD-10-CM

## 2022-04-10 DIAGNOSIS — Z8739 Personal history of other diseases of the musculoskeletal system and connective tissue: Secondary | ICD-10-CM | POA: Diagnosis not present

## 2022-04-10 MED ORDER — AZELASTINE HCL 0.1 % NA SOLN: 1.0000 | Freq: Two times a day (BID) | NASAL | 12 refills | Status: DC

## 2022-04-10 NOTE — Patient Instructions (Addendum)
Come in for fasting labs soon I will send Dr Rosana Hoes a copy of the prostate level Let me know if you need a referral for hearing testing or not. I sent the new nasal spray to your pharmacy but try the sample out before you pick it up. Ok to continue Singulair 68m at bedtime if it is useful.
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
## 2022-04-12 ENCOUNTER — Other Ambulatory Visit: Payer: Medicare Other

## 2022-04-12 DIAGNOSIS — Z8739 Personal history of other diseases of the musculoskeletal system and connective tissue: Secondary | ICD-10-CM

## 2022-04-12 DIAGNOSIS — I480 Paroxysmal atrial fibrillation: Secondary | ICD-10-CM | POA: Diagnosis not present

## 2022-04-12 DIAGNOSIS — E559 Vitamin D deficiency, unspecified: Secondary | ICD-10-CM

## 2022-04-12 DIAGNOSIS — E782 Mixed hyperlipidemia: Secondary | ICD-10-CM

## 2022-04-12 DIAGNOSIS — I129 Hypertensive chronic kidney disease with stage 1 through stage 4 chronic kidney disease, or unspecified chronic kidney disease: Secondary | ICD-10-CM

## 2022-04-12 DIAGNOSIS — I700 Atherosclerosis of aorta: Secondary | ICD-10-CM

## 2022-04-12 DIAGNOSIS — N401 Enlarged prostate with lower urinary tract symptoms: Secondary | ICD-10-CM

## 2022-04-12 DIAGNOSIS — N183 Chronic kidney disease, stage 3 unspecified: Secondary | ICD-10-CM | POA: Diagnosis not present

## 2022-04-13 LAB — CBC
Hematocrit: 42.5 % (ref 37.5–51.0)
Hemoglobin: 14.7 g/dL (ref 13.0–17.7)
MCH: 32.8 pg (ref 26.6–33.0)
MCHC: 34.6 g/dL (ref 31.5–35.7)
MCV: 95 fL (ref 79–97)
Platelets: 183 10*3/uL (ref 150–450)
RBC: 4.48 x10E6/uL (ref 4.14–5.80)
RDW: 12.1 % (ref 11.6–15.4)
WBC: 5.3 10*3/uL (ref 3.4–10.8)

## 2022-04-13 LAB — CMP14+EGFR
ALT: 19 IU/L (ref 0–44)
AST: 26 IU/L (ref 0–40)
Albumin/Globulin Ratio: 1.9 (ref 1.2–2.2)
Albumin: 4.1 g/dL (ref 3.7–4.7)
Alkaline Phosphatase: 75 IU/L (ref 44–121)
BUN/Creatinine Ratio: 9 — ABNORMAL LOW (ref 10–24)
BUN: 12 mg/dL (ref 8–27)
Bilirubin Total: 0.8 mg/dL (ref 0.0–1.2)
CO2: 23 mmol/L (ref 20–29)
Calcium: 9 mg/dL (ref 8.6–10.2)
Chloride: 104 mmol/L (ref 96–106)
Creatinine, Ser: 1.3 mg/dL — ABNORMAL HIGH (ref 0.76–1.27)
Globulin, Total: 2.2 g/dL (ref 1.5–4.5)
Glucose: 89 mg/dL (ref 70–99)
Potassium: 4.4 mmol/L (ref 3.5–5.2)
Sodium: 141 mmol/L (ref 134–144)
Total Protein: 6.3 g/dL (ref 6.0–8.5)
eGFR: 53 mL/min/{1.73_m2} — ABNORMAL LOW (ref >59)

## 2022-04-13 LAB — TSH: TSH: 1.44 u[IU]/mL (ref 0.450–4.500)

## 2022-04-13 LAB — LIPID PANEL
Chol/HDL Ratio: 2.1 ratio (ref 0.0–5.0)
Cholesterol, Total: 126 mg/dL (ref 100–199)
HDL: 59 mg/dL (ref >39)
LDL Chol Calc (NIH): 53 mg/dL (ref 0–99)
Triglycerides: 68 mg/dL (ref 0–149)
VLDL Cholesterol Cal: 14 mg/dL (ref 5–40)

## 2022-04-13 LAB — MAGNESIUM: Magnesium: 2.1 mg/dL (ref 1.6–2.3)

## 2022-04-13 LAB — VITAMIN D 25 HYDROXY (VIT D DEFICIENCY, FRACTURES): Vit D, 25-Hydroxy: 43 ng/mL (ref 30.0–100.0)

## 2022-04-13 LAB — URIC ACID: Uric Acid: 5.8 mg/dL (ref 3.8–8.4)

## 2022-04-13 LAB — PSA: Prostate Specific Ag, Serum: 0.8 ng/mL (ref 0.0–4.0)

## 2022-04-17 ENCOUNTER — Other Ambulatory Visit: Payer: Self-pay | Admitting: Family Medicine

## 2022-04-17 DIAGNOSIS — I129 Hypertensive chronic kidney disease with stage 1 through stage 4 chronic kidney disease, or unspecified chronic kidney disease: Secondary | ICD-10-CM

## 2022-04-25 DIAGNOSIS — M62830 Muscle spasm of back: Secondary | ICD-10-CM | POA: Diagnosis not present

## 2022-04-25 DIAGNOSIS — M9903 Segmental and somatic dysfunction of lumbar region: Secondary | ICD-10-CM | POA: Diagnosis not present

## 2022-04-25 DIAGNOSIS — M9905 Segmental and somatic dysfunction of pelvic region: Secondary | ICD-10-CM | POA: Diagnosis not present

## 2022-04-25 DIAGNOSIS — M9904 Segmental and somatic dysfunction of sacral region: Secondary | ICD-10-CM | POA: Diagnosis not present

## 2022-04-27 ENCOUNTER — Other Ambulatory Visit: Payer: Self-pay | Admitting: Family Medicine

## 2022-04-27 DIAGNOSIS — N401 Enlarged prostate with lower urinary tract symptoms: Secondary | ICD-10-CM

## 2022-05-08 ENCOUNTER — Other Ambulatory Visit: Payer: Self-pay | Admitting: Family Medicine

## 2022-05-08 DIAGNOSIS — N183 Chronic kidney disease, stage 3 unspecified: Secondary | ICD-10-CM

## 2022-05-23 ENCOUNTER — Other Ambulatory Visit: Payer: Self-pay | Admitting: Family Medicine

## 2022-05-23 DIAGNOSIS — M9905 Segmental and somatic dysfunction of pelvic region: Secondary | ICD-10-CM | POA: Diagnosis not present

## 2022-05-23 DIAGNOSIS — E782 Mixed hyperlipidemia: Secondary | ICD-10-CM

## 2022-05-23 DIAGNOSIS — M9904 Segmental and somatic dysfunction of sacral region: Secondary | ICD-10-CM | POA: Diagnosis not present

## 2022-05-23 DIAGNOSIS — M62830 Muscle spasm of back: Secondary | ICD-10-CM | POA: Diagnosis not present

## 2022-05-23 DIAGNOSIS — M9903 Segmental and somatic dysfunction of lumbar region: Secondary | ICD-10-CM | POA: Diagnosis not present

## 2022-06-08 DIAGNOSIS — B351 Tinea unguium: Secondary | ICD-10-CM | POA: Diagnosis not present

## 2022-06-08 DIAGNOSIS — M79676 Pain in unspecified toe(s): Secondary | ICD-10-CM | POA: Diagnosis not present

## 2022-06-12 DIAGNOSIS — M9903 Segmental and somatic dysfunction of lumbar region: Secondary | ICD-10-CM | POA: Diagnosis not present

## 2022-06-12 DIAGNOSIS — M62830 Muscle spasm of back: Secondary | ICD-10-CM | POA: Diagnosis not present

## 2022-06-12 DIAGNOSIS — M9904 Segmental and somatic dysfunction of sacral region: Secondary | ICD-10-CM | POA: Diagnosis not present

## 2022-06-12 DIAGNOSIS — M9905 Segmental and somatic dysfunction of pelvic region: Secondary | ICD-10-CM | POA: Diagnosis not present

## 2022-06-14 DIAGNOSIS — M62830 Muscle spasm of back: Secondary | ICD-10-CM | POA: Diagnosis not present

## 2022-06-14 DIAGNOSIS — M9903 Segmental and somatic dysfunction of lumbar region: Secondary | ICD-10-CM | POA: Diagnosis not present

## 2022-06-14 DIAGNOSIS — M9905 Segmental and somatic dysfunction of pelvic region: Secondary | ICD-10-CM | POA: Diagnosis not present

## 2022-06-14 DIAGNOSIS — M9904 Segmental and somatic dysfunction of sacral region: Secondary | ICD-10-CM | POA: Diagnosis not present

## 2022-06-15 DIAGNOSIS — M9903 Segmental and somatic dysfunction of lumbar region: Secondary | ICD-10-CM | POA: Diagnosis not present

## 2022-06-15 DIAGNOSIS — M9904 Segmental and somatic dysfunction of sacral region: Secondary | ICD-10-CM | POA: Diagnosis not present

## 2022-06-15 DIAGNOSIS — M9905 Segmental and somatic dysfunction of pelvic region: Secondary | ICD-10-CM | POA: Diagnosis not present

## 2022-06-15 DIAGNOSIS — M62830 Muscle spasm of back: Secondary | ICD-10-CM | POA: Diagnosis not present

## 2022-06-20 DIAGNOSIS — M9905 Segmental and somatic dysfunction of pelvic region: Secondary | ICD-10-CM | POA: Diagnosis not present

## 2022-06-20 DIAGNOSIS — M9903 Segmental and somatic dysfunction of lumbar region: Secondary | ICD-10-CM | POA: Diagnosis not present

## 2022-06-20 DIAGNOSIS — M62830 Muscle spasm of back: Secondary | ICD-10-CM | POA: Diagnosis not present

## 2022-06-20 DIAGNOSIS — M9904 Segmental and somatic dysfunction of sacral region: Secondary | ICD-10-CM | POA: Diagnosis not present

## 2022-06-27 DIAGNOSIS — M62830 Muscle spasm of back: Secondary | ICD-10-CM | POA: Diagnosis not present

## 2022-06-27 DIAGNOSIS — M9905 Segmental and somatic dysfunction of pelvic region: Secondary | ICD-10-CM | POA: Diagnosis not present

## 2022-06-27 DIAGNOSIS — M9904 Segmental and somatic dysfunction of sacral region: Secondary | ICD-10-CM | POA: Diagnosis not present

## 2022-06-27 DIAGNOSIS — M9903 Segmental and somatic dysfunction of lumbar region: Secondary | ICD-10-CM | POA: Diagnosis not present

## 2022-07-03 DIAGNOSIS — M9904 Segmental and somatic dysfunction of sacral region: Secondary | ICD-10-CM | POA: Diagnosis not present

## 2022-07-03 DIAGNOSIS — M62830 Muscle spasm of back: Secondary | ICD-10-CM | POA: Diagnosis not present

## 2022-07-03 DIAGNOSIS — M9903 Segmental and somatic dysfunction of lumbar region: Secondary | ICD-10-CM | POA: Diagnosis not present

## 2022-07-03 DIAGNOSIS — M9905 Segmental and somatic dysfunction of pelvic region: Secondary | ICD-10-CM | POA: Diagnosis not present

## 2022-07-10 DIAGNOSIS — M9903 Segmental and somatic dysfunction of lumbar region: Secondary | ICD-10-CM | POA: Diagnosis not present

## 2022-07-10 DIAGNOSIS — M9905 Segmental and somatic dysfunction of pelvic region: Secondary | ICD-10-CM | POA: Diagnosis not present

## 2022-07-10 DIAGNOSIS — M62830 Muscle spasm of back: Secondary | ICD-10-CM | POA: Diagnosis not present

## 2022-07-10 DIAGNOSIS — M9904 Segmental and somatic dysfunction of sacral region: Secondary | ICD-10-CM | POA: Diagnosis not present

## 2022-07-24 DIAGNOSIS — M9903 Segmental and somatic dysfunction of lumbar region: Secondary | ICD-10-CM | POA: Diagnosis not present

## 2022-07-24 DIAGNOSIS — M9904 Segmental and somatic dysfunction of sacral region: Secondary | ICD-10-CM | POA: Diagnosis not present

## 2022-07-24 DIAGNOSIS — M9905 Segmental and somatic dysfunction of pelvic region: Secondary | ICD-10-CM | POA: Diagnosis not present

## 2022-07-24 DIAGNOSIS — M62830 Muscle spasm of back: Secondary | ICD-10-CM | POA: Diagnosis not present

## 2022-08-07 DIAGNOSIS — M9905 Segmental and somatic dysfunction of pelvic region: Secondary | ICD-10-CM | POA: Diagnosis not present

## 2022-08-07 DIAGNOSIS — M9904 Segmental and somatic dysfunction of sacral region: Secondary | ICD-10-CM | POA: Diagnosis not present

## 2022-08-07 DIAGNOSIS — M62830 Muscle spasm of back: Secondary | ICD-10-CM | POA: Diagnosis not present

## 2022-08-07 DIAGNOSIS — M9903 Segmental and somatic dysfunction of lumbar region: Secondary | ICD-10-CM | POA: Diagnosis not present

## 2022-08-15 ENCOUNTER — Other Ambulatory Visit: Payer: Self-pay | Admitting: Family Medicine

## 2022-08-21 DIAGNOSIS — M62830 Muscle spasm of back: Secondary | ICD-10-CM | POA: Diagnosis not present

## 2022-08-21 DIAGNOSIS — M9905 Segmental and somatic dysfunction of pelvic region: Secondary | ICD-10-CM | POA: Diagnosis not present

## 2022-08-21 DIAGNOSIS — M9903 Segmental and somatic dysfunction of lumbar region: Secondary | ICD-10-CM | POA: Diagnosis not present

## 2022-08-21 DIAGNOSIS — M9904 Segmental and somatic dysfunction of sacral region: Secondary | ICD-10-CM | POA: Diagnosis not present

## 2022-08-23 DIAGNOSIS — L820 Inflamed seborrheic keratosis: Secondary | ICD-10-CM | POA: Diagnosis not present

## 2022-08-23 DIAGNOSIS — L570 Actinic keratosis: Secondary | ICD-10-CM | POA: Diagnosis not present

## 2022-09-14 DIAGNOSIS — B351 Tinea unguium: Secondary | ICD-10-CM | POA: Diagnosis not present

## 2022-09-14 DIAGNOSIS — M79676 Pain in unspecified toe(s): Secondary | ICD-10-CM | POA: Diagnosis not present

## 2022-09-18 DIAGNOSIS — M9905 Segmental and somatic dysfunction of pelvic region: Secondary | ICD-10-CM | POA: Diagnosis not present

## 2022-09-18 DIAGNOSIS — M62830 Muscle spasm of back: Secondary | ICD-10-CM | POA: Diagnosis not present

## 2022-09-18 DIAGNOSIS — M9903 Segmental and somatic dysfunction of lumbar region: Secondary | ICD-10-CM | POA: Diagnosis not present

## 2022-09-18 DIAGNOSIS — M9904 Segmental and somatic dysfunction of sacral region: Secondary | ICD-10-CM | POA: Diagnosis not present

## 2022-10-11 ENCOUNTER — Ambulatory Visit (INDEPENDENT_AMBULATORY_CARE_PROVIDER_SITE_OTHER): Payer: Medicare Other | Admitting: Family Medicine

## 2022-10-11 ENCOUNTER — Encounter: Payer: Self-pay | Admitting: Family Medicine

## 2022-10-11 VITALS — BP 128/68 | HR 73 | Temp 98.5°F | Ht 68.0 in | Wt 160.0 lb

## 2022-10-11 DIAGNOSIS — N183 Chronic kidney disease, stage 3 unspecified: Secondary | ICD-10-CM

## 2022-10-11 DIAGNOSIS — I480 Paroxysmal atrial fibrillation: Secondary | ICD-10-CM | POA: Diagnosis not present

## 2022-10-11 DIAGNOSIS — R351 Nocturia: Secondary | ICD-10-CM

## 2022-10-11 DIAGNOSIS — E782 Mixed hyperlipidemia: Secondary | ICD-10-CM

## 2022-10-11 DIAGNOSIS — I129 Hypertensive chronic kidney disease with stage 1 through stage 4 chronic kidney disease, or unspecified chronic kidney disease: Secondary | ICD-10-CM

## 2022-10-11 DIAGNOSIS — N401 Enlarged prostate with lower urinary tract symptoms: Secondary | ICD-10-CM | POA: Diagnosis not present

## 2022-10-11 MED ORDER — ATORVASTATIN CALCIUM 20 MG PO TABS
20.0000 mg | ORAL_TABLET | Freq: Every day | ORAL | 3 refills | Status: DC
Start: 2022-10-11 — End: 2023-04-20

## 2022-10-11 MED ORDER — OLMESARTAN MEDOXOMIL 40 MG PO TABS
40.0000 mg | ORAL_TABLET | Freq: Every day | ORAL | 3 refills | Status: DC
Start: 2022-10-11 — End: 2023-04-20

## 2022-10-11 MED ORDER — ALLOPURINOL 100 MG PO TABS: 100.0000 mg | ORAL_TABLET | Freq: Every day | ORAL | 3 refills | Status: DC

## 2022-10-11 MED ORDER — FINASTERIDE 5 MG PO TABS
5.0000 mg | ORAL_TABLET | Freq: Every day | ORAL | 3 refills | Status: DC
Start: 2022-10-11 — End: 2023-04-20

## 2022-10-11 MED ORDER — AMLODIPINE BESYLATE 5 MG PO TABS
5.0000 mg | ORAL_TABLET | Freq: Every day | ORAL | 3 refills | Status: DC
Start: 2022-10-11 — End: 2023-04-20

## 2022-10-11 NOTE — Patient Instructions (Signed)
Happy early [place] [employer]uch!  The restaurant is called [employer] Address: [address] [place], [place] [address] Phone: [phone]
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
## 2022-10-12 LAB — CBC
Hematocrit: 41.5 % (ref 37.5–51.0)
Hemoglobin: 14.2 g/dL (ref 13.0–17.7)
MCH: 32.1 pg (ref 26.6–33.0)
MCHC: 34.2 g/dL (ref 31.5–35.7)
MCV: 94 fL (ref 79–97)
Platelets: 177 10*3/uL (ref 150–450)
RBC: 4.42 x10E6/uL (ref 4.14–5.80)
RDW: 12.4 % (ref 11.6–15.4)
WBC: 5.8 10*3/uL (ref 3.4–10.8)

## 2022-10-12 LAB — RENAL FUNCTION PANEL
Albumin: 4.3 g/dL (ref 3.7–4.7)
BUN/Creatinine Ratio: 10 (ref 10–24)
BUN: 13 mg/dL (ref 8–27)
CO2: 25 mmol/L (ref 20–29)
Calcium: 9.1 mg/dL (ref 8.6–10.2)
Chloride: 105 mmol/L (ref 96–106)
Creatinine, Ser: 1.34 mg/dL — ABNORMAL HIGH (ref 0.76–1.27)
Glucose: 91 mg/dL (ref 70–99)
Phosphorus: 3 mg/dL (ref 2.8–4.1)
Potassium: 4.5 mmol/L (ref 3.5–5.2)
Sodium: 142 mmol/L (ref 134–144)
eGFR: 51 mL/min/{1.73_m2} — ABNORMAL LOW (ref >59)

## 2022-10-16 DIAGNOSIS — M9905 Segmental and somatic dysfunction of pelvic region: Secondary | ICD-10-CM | POA: Diagnosis not present

## 2022-10-16 DIAGNOSIS — M9903 Segmental and somatic dysfunction of lumbar region: Secondary | ICD-10-CM | POA: Diagnosis not present

## 2022-10-16 DIAGNOSIS — M62830 Muscle spasm of back: Secondary | ICD-10-CM | POA: Diagnosis not present

## 2022-10-16 DIAGNOSIS — M9904 Segmental and somatic dysfunction of sacral region: Secondary | ICD-10-CM | POA: Diagnosis not present

## 2022-11-27 DIAGNOSIS — M62830 Muscle spasm of back: Secondary | ICD-10-CM | POA: Diagnosis not present

## 2022-11-27 DIAGNOSIS — M9903 Segmental and somatic dysfunction of lumbar region: Secondary | ICD-10-CM | POA: Diagnosis not present

## 2022-11-27 DIAGNOSIS — M9904 Segmental and somatic dysfunction of sacral region: Secondary | ICD-10-CM | POA: Diagnosis not present

## 2022-11-27 DIAGNOSIS — M9905 Segmental and somatic dysfunction of pelvic region: Secondary | ICD-10-CM | POA: Diagnosis not present

## 2022-12-13 ENCOUNTER — Ambulatory Visit: Payer: Medicare Other

## 2022-12-13 ENCOUNTER — Telehealth: Payer: Self-pay | Admitting: Family Medicine

## 2022-12-13 VITALS — Ht 68.0 in | Wt 160.0 lb

## 2022-12-13 DIAGNOSIS — Z0000 Encounter for general adult medical examination without abnormal findings: Secondary | ICD-10-CM

## 2022-12-13 NOTE — Telephone Encounter (Signed)
Patient called about taking his FLU shot.   He has questions about getting it and reaction with his current medications.  Asked to be called before his AWV with Vernona Rieger this afternoon.  Thank you,  Judeth Cornfield,  AMB Clinical Support [facility] AWV Program Direct Dial ??[phone]

## 2022-12-13 NOTE — Patient Instructions (Signed)
Brian Blankenship , Thank you for taking time to come for your Medicare Wellness Visit. I appreciate your ongoing commitment to your health goals. Please review the following plan we discussed and let me know if I can assist you in the future.   Referrals/Orders/Follow-Ups/Clinician Recommendations: Aim for 30 minutes of exercise or brisk walking, 6-8 glasses of water, and 5 servings of fruits and vegetables each day.   This is a list of the screening recommended for you and due dates:  Health Maintenance  Topic Date Due   Flu Shot  09/21/2022   COVID-19 Vaccine (8 - 2023-24 season) 10/22/2022   Medicare Annual Wellness Visit  12/13/2023   DTaP/Tdap/Td vaccine (3 - Td or Tdap) 02/27/2024   Pneumonia Vaccine  Completed   Zoster (Shingles) Vaccine  Completed   HPV Vaccine  Aged Out    Advanced directives: (Copy Requested) Please bring a copy of your health care power of attorney and living will to the office to be added to your chart at your convenience.  Next Medicare Annual Wellness Visit scheduled for next year: Yes

## 2022-12-13 NOTE — Telephone Encounter (Signed)
Sadly, this was routed to me later in the afternoon.  No contraindications to flu shot with current meds/ medical conditions. In fact, current medical conditions support use due to impaired immune system
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
## 2022-12-14 DIAGNOSIS — B351 Tinea unguium: Secondary | ICD-10-CM | POA: Diagnosis not present

## 2022-12-14 DIAGNOSIS — M79676 Pain in unspecified toe(s): Secondary | ICD-10-CM | POA: Diagnosis not present

## 2022-12-14 NOTE — Telephone Encounter (Signed)
Patient aware.

## 2023-01-22 ENCOUNTER — Other Ambulatory Visit: Payer: Self-pay | Admitting: Family Medicine

## 2023-03-15 DIAGNOSIS — B351 Tinea unguium: Secondary | ICD-10-CM | POA: Diagnosis not present

## 2023-03-15 DIAGNOSIS — M79676 Pain in unspecified toe(s): Secondary | ICD-10-CM | POA: Diagnosis not present

## 2023-04-20 ENCOUNTER — Encounter: Payer: Self-pay | Admitting: Family Medicine

## 2023-04-20 ENCOUNTER — Ambulatory Visit: Payer: Medicare Other | Admitting: Family Medicine

## 2023-04-20 VITALS — BP 125/60 | HR 74 | Temp 98.3°F | Ht 68.0 in | Wt 161.0 lb

## 2023-04-20 DIAGNOSIS — I129 Hypertensive chronic kidney disease with stage 1 through stage 4 chronic kidney disease, or unspecified chronic kidney disease: Secondary | ICD-10-CM | POA: Diagnosis not present

## 2023-04-20 DIAGNOSIS — I700 Atherosclerosis of aorta: Secondary | ICD-10-CM

## 2023-04-20 DIAGNOSIS — R351 Nocturia: Secondary | ICD-10-CM

## 2023-04-20 DIAGNOSIS — N183 Chronic kidney disease, stage 3 unspecified: Secondary | ICD-10-CM

## 2023-04-20 DIAGNOSIS — Z0001 Encounter for general adult medical examination with abnormal findings: Secondary | ICD-10-CM

## 2023-04-20 DIAGNOSIS — E559 Vitamin D deficiency, unspecified: Secondary | ICD-10-CM

## 2023-04-20 DIAGNOSIS — Z8739 Personal history of other diseases of the musculoskeletal system and connective tissue: Secondary | ICD-10-CM | POA: Diagnosis not present

## 2023-04-20 DIAGNOSIS — I480 Paroxysmal atrial fibrillation: Secondary | ICD-10-CM

## 2023-04-20 DIAGNOSIS — E782 Mixed hyperlipidemia: Secondary | ICD-10-CM

## 2023-04-20 DIAGNOSIS — Z0000 Encounter for general adult medical examination without abnormal findings: Secondary | ICD-10-CM

## 2023-04-20 DIAGNOSIS — N401 Enlarged prostate with lower urinary tract symptoms: Secondary | ICD-10-CM

## 2023-04-20 MED ORDER — AMLODIPINE BESYLATE 5 MG PO TABS: 5.0000 mg | ORAL_TABLET | Freq: Every day | ORAL | 3 refills | Status: AC

## 2023-04-20 MED ORDER — FINASTERIDE 5 MG PO TABS: 5.0000 mg | ORAL_TABLET | Freq: Every day | ORAL | 3 refills | Status: AC

## 2023-04-20 MED ORDER — ASPIRIN 81 MG PO TBEC: DELAYED_RELEASE_TABLET | ORAL | Status: AC

## 2023-04-20 MED ORDER — ATORVASTATIN CALCIUM 20 MG PO TABS: 20.0000 mg | ORAL_TABLET | Freq: Every day | ORAL | 3 refills | Status: AC

## 2023-04-20 MED ORDER — OLMESARTAN MEDOXOMIL 40 MG PO TABS: 40.0000 mg | ORAL_TABLET | Freq: Every day | ORAL | 3 refills | Status: AC

## 2023-04-20 MED ORDER — ALLOPURINOL 100 MG PO TABS: 100.0000 mg | ORAL_TABLET | Freq: Every day | ORAL | 3 refills | Status: AC
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
## 2023-04-20 NOTE — Patient Instructions (Signed)
Exercise Information for Aging Adults Staying physically active is important as you age. Physical activity and exercise can help in maintaining quality of life, health, physical function, and reducing falls. The four types of exercises that are best for older adults are endurance, strength, balance, and flexibility. Contact your health care provider before you start any exercise routine. Ask your health care provider what activities are safe for you. What are the risks? Risks associated with exercising include: Overdoing it. This may lead to sore muscles or fatigue. Falls. Injuries. Dehydration. How to do these exercises Endurance exercises Endurance (aerobic) exercises raise your breathing rate and heart rate. Increasing your endurance helps you do everyday tasks and stay healthy. By improving the health of your body system that includes your heart, lungs, and blood vessels (circulatory system), you may also delay or prevent diseases such as heart disease, diabetes, and weak bones (osteoporosis). Types of endurance exercises include: Sports. Indoor activities, such as using gym equipment, doing water aerobics, or dancing. Outdoor activities, such as biking or jogging. Tasks around the house, such as gardening, yard work, and heavy household chores like cleaning. Walking, such as hiking or walking around your neighborhood. When doing endurance exercises, make sure you: Are aware of your surroundings. Use safety equipment as directed. Dress in layers when exercising outdoors. Drink plenty of water to stay well hydrated. Build up endurance slowly. Start with 10 minutes at a time, and gradually build up to doing 30 minutes at a time. Unless your health care provider gave you different instructions, aim to exercise for a total of 150 minutes a week. Spread out that time so you are working on endurance 3 or more days a week. Strength exercises Lifting, pulling, or pushing weights helps to  strengthen muscles. Having stronger muscles makes it easier to do everyday activities, such as getting up from a chair, climbing stairs, carrying groceries, and playing with grandchildren. Strength exercises include arm and leg exercises that may be done: With weights. Without weights (using your own body weight). With a resistance band. When doing strength exercises: Move smoothly and steadily. Do not suddenly thrust or jerk the weights, the resistance band, or your body. Start with no weights or with light weights, and gradually add more weight over time. Eventually, aim to use weights that are hard or very hard for you to lift. This means that you are able to do 8 repetitions with the weight, and the last few repetitions are very challenging. Lift or push weights into position for 3 seconds, hold the position for 1 second, and then take 3 seconds to return to your starting position. Breathe out (exhale) during difficult movements, like lifting or pushing weights. Breathe in (inhale) to relax your muscles before the next repetition. Consider alternating arms or legs, especially when you first start strength exercises. Expect some slight muscle soreness after each session. Do strength exercises on 2 or more days a week, for 30 minutes at a time. Avoid exercising the same muscle groups two days in a row. For example, if you work on your leg muscles one day, work on your arm muscles the next day. When you can do two sets of 10-15 repetitions with a certain weight, increase the amount of weight. Balance exercises Balance exercises can help to prevent falls. Balance exercises include: Standing on one foot. Heel-to-toe walk. Balance walk. Tai chi. Make sure you have something sturdy to hold onto while doing balance exercises, such as a sturdy chair. As your balance   improves, challenge yourself by holding on to the chair with one hand instead of two, and then with no hands. Trying exercises with your  eyes closed also challenges your balance, but be sure to have a sturdy surface (like a countertop) close by in case you need it. Do balance exercises as often as you want, or as often as directed by your health care provider. Flexibility exercises  Flexibility exercises improve how far you can bend, straighten, move, or rotate parts of your body (range of motion). These exercises also help you do everyday activities such as getting dressed or reaching for objects. Flexibility exercises include stretching different parts of the body, and they may be done in a standing or seated position or on the floor. When stretching, make sure you: Keep a slight bend in your arms and legs. Avoid completely straightening ("locking") your joints. Do not stretch so far that you feel pain. You should feel a mild stretching feeling. You may try stretching farther as you become more flexible over time. Relax and breathe between stretches. Hold on to something sturdy for balance as needed. Hold each stretch for 10-30 seconds. Repeat each stretch 3-5 times. General safety tips Exercise in well-lit areas. Do not hold your breath during exercises or stretches. Warm up before exercising, and cool down after exercising. This can help prevent injury. Drink plenty of water during exercise or any activity that makes you sweat. If you are not sure if an exercise is safe for you, or you are not sure how to do an exercise, talk with your health care provider. This is especially important if you have had surgery on muscles, bones, or joints (orthopedic surgery). Where to find more information You can find more information about exercise for older adults from: Your local health department, fitness center, or community center. These facilities may have programs for aging adults. National Institute on Aging: www.nia.nih.gov National Council on Aging: www.ncoa.org Summary Staying physically active is important as you age. Doing  endurance, strength, balance, and flexibility exercises can help in maintaining quality of life, health, physical function, and reducing falls. Make sure to contact your health care provider before you start any exercise routine. Ask your health care provider what activities are safe for you. This information is not intended to replace advice given to you by your health care provider. Make sure you discuss any questions you have with your health care provider. Document Revised: 06/21/2020 Document Reviewed: 06/21/2020 Elsevier Patient Education  2024 [employer]  

## 2023-04-21 LAB — CBC
Hematocrit: 43.8 % (ref 37.5–51.0)
Hemoglobin: 14.5 g/dL (ref 13.0–17.7)
MCH: 31.4 pg (ref 26.6–33.0)
MCHC: 33.1 g/dL (ref 31.5–35.7)
MCV: 95 fL (ref 79–97)
Platelets: 199 10*3/uL (ref 150–450)
RBC: 4.62 x10E6/uL (ref 4.14–5.80)
RDW: 12.2 % (ref 11.6–15.4)
WBC: 6.5 10*3/uL (ref 3.4–10.8)

## 2023-04-21 LAB — CMP14+EGFR
ALT: 18 IU/L (ref 0–44)
AST: 23 IU/L (ref 0–40)
Albumin: 4.3 g/dL (ref 3.6–4.6)
Alkaline Phosphatase: 93 IU/L (ref 44–121)
BUN/Creatinine Ratio: 11 (ref 10–24)
BUN: 16 mg/dL (ref 10–36)
Bilirubin Total: 0.9 mg/dL (ref 0.0–1.2)
CO2: 23 mmol/L (ref 20–29)
Calcium: 9.6 mg/dL (ref 8.6–10.2)
Chloride: 104 mmol/L (ref 96–106)
Creatinine, Ser: 1.45 mg/dL — ABNORMAL HIGH (ref 0.76–1.27)
Globulin, Total: 2.3 g/dL (ref 1.5–4.5)
Glucose: 97 mg/dL (ref 70–99)
Potassium: 4.9 mmol/L (ref 3.5–5.2)
Sodium: 141 mmol/L (ref 134–144)
Total Protein: 6.6 g/dL (ref 6.0–8.5)
eGFR: 46 mL/min/{1.73_m2} — ABNORMAL LOW (ref >59)

## 2023-04-21 LAB — LIPID PANEL
Chol/HDL Ratio: 2.6 ratio (ref 0.0–5.0)
Cholesterol, Total: 133 mg/dL (ref 100–199)
HDL: 51 mg/dL (ref >39)
LDL Chol Calc (NIH): 64 mg/dL (ref 0–99)
Triglycerides: 94 mg/dL (ref 0–149)
VLDL Cholesterol Cal: 18 mg/dL (ref 5–40)

## 2023-04-21 LAB — VITAMIN D 25 HYDROXY (VIT D DEFICIENCY, FRACTURES): Vit D, 25-Hydroxy: 40 ng/mL (ref 30.0–100.0)

## 2023-04-21 LAB — URIC ACID: Uric Acid: 6.6 mg/dL (ref 3.8–8.4)

## 2023-04-21 LAB — PSA: Prostate Specific Ag, Serum: 1 ng/mL (ref 0.0–4.0)

## 2023-04-21 LAB — TSH: TSH: 1.76 u[IU]/mL (ref 0.450–4.500)

## 2023-04-21 LAB — MAGNESIUM: Magnesium: 2.2 mg/dL (ref 1.6–2.3)

## 2023-04-23 ENCOUNTER — Encounter: Payer: Self-pay | Admitting: Family Medicine

## 2023-04-23 NOTE — Telephone Encounter (Signed)
 Copied from CRM [id number]. Topic: Clinical - Lab/Test Results >> Apr 23, 2023 10:27 AM Fuller Mandril wrote: Reason for CRM: Patient returned call for results. Read note as written by provider. No further questions at this time.

## 2023-06-14 DIAGNOSIS — M79672 Pain in left foot: Secondary | ICD-10-CM | POA: Diagnosis not present

## 2023-06-14 DIAGNOSIS — M79671 Pain in right foot: Secondary | ICD-10-CM | POA: Diagnosis not present

## 2023-06-14 DIAGNOSIS — B351 Tinea unguium: Secondary | ICD-10-CM | POA: Diagnosis not present

## 2023-08-29 DIAGNOSIS — L821 Other seborrheic keratosis: Secondary | ICD-10-CM | POA: Diagnosis not present

## 2023-08-29 DIAGNOSIS — B351 Tinea unguium: Secondary | ICD-10-CM | POA: Diagnosis not present

## 2023-08-29 DIAGNOSIS — L570 Actinic keratosis: Secondary | ICD-10-CM | POA: Diagnosis not present

## 2023-08-29 DIAGNOSIS — D1801 Hemangioma of skin and subcutaneous tissue: Secondary | ICD-10-CM | POA: Diagnosis not present

## 2023-08-30 DIAGNOSIS — M79675 Pain in left toe(s): Secondary | ICD-10-CM | POA: Diagnosis not present

## 2023-08-30 DIAGNOSIS — B351 Tinea unguium: Secondary | ICD-10-CM | POA: Diagnosis not present

## 2023-08-30 DIAGNOSIS — M79674 Pain in right toe(s): Secondary | ICD-10-CM | POA: Diagnosis not present

## 2023-10-11 DIAGNOSIS — M9903 Segmental and somatic dysfunction of lumbar region: Secondary | ICD-10-CM | POA: Diagnosis not present

## 2023-10-11 DIAGNOSIS — M9904 Segmental and somatic dysfunction of sacral region: Secondary | ICD-10-CM | POA: Diagnosis not present

## 2023-10-11 DIAGNOSIS — M9905 Segmental and somatic dysfunction of pelvic region: Secondary | ICD-10-CM | POA: Diagnosis not present

## 2023-10-11 DIAGNOSIS — M62830 Muscle spasm of back: Secondary | ICD-10-CM | POA: Diagnosis not present

## 2023-10-15 DIAGNOSIS — M9904 Segmental and somatic dysfunction of sacral region: Secondary | ICD-10-CM | POA: Diagnosis not present

## 2023-10-15 DIAGNOSIS — M62830 Muscle spasm of back: Secondary | ICD-10-CM | POA: Diagnosis not present

## 2023-10-15 DIAGNOSIS — M9905 Segmental and somatic dysfunction of pelvic region: Secondary | ICD-10-CM | POA: Diagnosis not present

## 2023-10-15 DIAGNOSIS — M9903 Segmental and somatic dysfunction of lumbar region: Secondary | ICD-10-CM | POA: Diagnosis not present

## 2023-10-18 DIAGNOSIS — M9905 Segmental and somatic dysfunction of pelvic region: Secondary | ICD-10-CM | POA: Diagnosis not present

## 2023-10-18 DIAGNOSIS — M62830 Muscle spasm of back: Secondary | ICD-10-CM | POA: Diagnosis not present

## 2023-10-18 DIAGNOSIS — M9904 Segmental and somatic dysfunction of sacral region: Secondary | ICD-10-CM | POA: Diagnosis not present

## 2023-10-18 DIAGNOSIS — M9903 Segmental and somatic dysfunction of lumbar region: Secondary | ICD-10-CM | POA: Diagnosis not present

## 2023-10-23 ENCOUNTER — Encounter: Payer: Self-pay | Admitting: Family Medicine

## 2023-10-23 ENCOUNTER — Ambulatory Visit: Payer: Medicare Other | Admitting: Family Medicine

## 2023-10-23 VITALS — BP 144/68 | HR 71 | Temp 97.7°F | Ht 68.0 in | Wt 156.0 lb

## 2023-10-23 DIAGNOSIS — Z23 Encounter for immunization: Secondary | ICD-10-CM

## 2023-10-23 DIAGNOSIS — N183 Chronic kidney disease, stage 3 unspecified: Secondary | ICD-10-CM

## 2023-10-23 DIAGNOSIS — Z8739 Personal history of other diseases of the musculoskeletal system and connective tissue: Secondary | ICD-10-CM | POA: Diagnosis not present

## 2023-10-23 DIAGNOSIS — I129 Hypertensive chronic kidney disease with stage 1 through stage 4 chronic kidney disease, or unspecified chronic kidney disease: Secondary | ICD-10-CM
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
## 2023-10-24 ENCOUNTER — Ambulatory Visit: Payer: Self-pay | Admitting: Family Medicine

## 2023-10-24 ENCOUNTER — Telehealth: Payer: Self-pay

## 2023-10-24 LAB — RENAL FUNCTION PANEL
Albumin: 4.3 g/dL (ref 3.6–4.6)
BUN/Creatinine Ratio: 8 — ABNORMAL LOW (ref 10–24)
BUN: 11 mg/dL (ref 10–36)
CO2: 23 mmol/L (ref 20–29)
Calcium: 9.5 mg/dL (ref 8.6–10.2)
Chloride: 103 mmol/L (ref 96–106)
Creatinine, Ser: 1.34 mg/dL — ABNORMAL HIGH (ref 0.76–1.27)
Glucose: 96 mg/dL (ref 70–99)
Phosphorus: 3 mg/dL (ref 2.8–4.1)
Potassium: 4.4 mmol/L (ref 3.5–5.2)
Sodium: 141 mmol/L (ref 134–144)
eGFR: 50 mL/min/1.73 — ABNORMAL LOW (ref >59)

## 2023-10-24 NOTE — Telephone Encounter (Signed)
 Copied from CRM #[id number]. Topic: Clinical - Lab/Test Results >> Oct 24, 2023  9:36 AM Tobias CROME wrote: Reason for CRM: Relayed results to patient, no further questions.

## 2023-10-24 NOTE — Telephone Encounter (Signed)
 Noted

## 2023-11-08 DIAGNOSIS — B351 Tinea unguium: Secondary | ICD-10-CM | POA: Diagnosis not present

## 2023-11-08 DIAGNOSIS — M79674 Pain in right toe(s): Secondary | ICD-10-CM | POA: Diagnosis not present

## 2023-11-08 DIAGNOSIS — M79675 Pain in left toe(s): Secondary | ICD-10-CM | POA: Diagnosis not present

## 2023-11-15 DIAGNOSIS — M62830 Muscle spasm of back: Secondary | ICD-10-CM | POA: Diagnosis not present

## 2023-11-15 DIAGNOSIS — M9903 Segmental and somatic dysfunction of lumbar region: Secondary | ICD-10-CM | POA: Diagnosis not present

## 2023-11-15 DIAGNOSIS — M9905 Segmental and somatic dysfunction of pelvic region: Secondary | ICD-10-CM | POA: Diagnosis not present

## 2023-11-15 DIAGNOSIS — M9904 Segmental and somatic dysfunction of sacral region: Secondary | ICD-10-CM | POA: Diagnosis not present

## 2023-11-27 ENCOUNTER — Encounter: Payer: Self-pay | Admitting: *Deleted

## 2023-12-10 DIAGNOSIS — M9905 Segmental and somatic dysfunction of pelvic region: Secondary | ICD-10-CM | POA: Diagnosis not present

## 2023-12-10 DIAGNOSIS — M9903 Segmental and somatic dysfunction of lumbar region: Secondary | ICD-10-CM | POA: Diagnosis not present

## 2023-12-10 DIAGNOSIS — M62830 Muscle spasm of back: Secondary | ICD-10-CM | POA: Diagnosis not present

## 2023-12-10 DIAGNOSIS — M9904 Segmental and somatic dysfunction of sacral region: Secondary | ICD-10-CM | POA: Diagnosis not present

## 2023-12-14 ENCOUNTER — Ambulatory Visit: Payer: Medicare Other

## 2023-12-14 VITALS — BP 144/68 | HR 71 | Ht 68.0 in | Wt 156.0 lb

## 2023-12-14 DIAGNOSIS — Z0000 Encounter for general adult medical examination without abnormal findings: Secondary | ICD-10-CM
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
## 2023-12-14 NOTE — Patient Instructions (Signed)
 [name],  Thank you for taking the time for your Medicare Wellness Visit. I appreciate your continued commitment to your health goals. Please review the care plan we discussed, and feel free to reach out if I can assist you further.  Medicare recommends these wellness visits once per year to help you and your care team stay ahead of potential health issues. These visits are designed to focus on prevention, allowing your provider to concentrate on managing your acute and chronic conditions during your regular appointments.  Please note that Annual Wellness Visits do not include a physical exam. Some assessments may be limited, especially if the visit was conducted virtually. If needed, we may recommend a separate in-person follow-up with your provider.  Ongoing Care Seeing your primary care provider every 3 to 6 months helps us  monitor your health and provide consistent, personalized care.   Referrals If a referral was made during today's visit and you haven't received any updates within two weeks, please contact the referred provider directly to check on the status.  Recommended Screenings:  Health Maintenance  Topic Date Due   COVID-19 Vaccine (9 - 2025-26 season) 10/22/2023   Medicare Annual Wellness Visit  12/13/2023   DTaP/Tdap/Td vaccine (3 - Td or Tdap) 02/27/2024   Pneumococcal Vaccine for age over 38  Completed   Flu Shot  Completed   Zoster (Shingles) Vaccine  Completed   Meningitis B Vaccine  Aged Out       12/14/2023   10:08 AM  Advanced Directives  Does Patient Have a Medical Advance Directive? Yes  Type of Estate agent of [place];Living will  Copy of Healthcare Power of Attorney in Chart? No - copy requested   Advance Care Planning is important because it: Ensures you receive medical care that aligns with your values, goals, and preferences. Provides guidance to your family and loved ones, reducing the emotional burden of decision-making  during critical moments.  Vision: Annual vision screenings are recommended for early detection of glaucoma, cataracts, and diabetic retinopathy. These exams can also reveal signs of chronic conditions such as diabetes and high blood pressure.  Dental: Annual dental screenings help detect early signs of oral cancer, gum disease, and other conditions linked to overall health, including heart disease and diabetes.  Please see the attached documents for additional preventive care recommendations.

## 2024-04-25 ENCOUNTER — Encounter: Payer: Medicare Other | Admitting: Family Medicine

## 2024-06-03 ENCOUNTER — Encounter: Payer: Self-pay | Admitting: Family Medicine

## 2024-06-20 ENCOUNTER — Encounter: Admitting: Family Medicine

## 2024-12-16 ENCOUNTER — Ambulatory Visit
